# Patient Record
Sex: Male | Born: 1935 | Race: White | Hispanic: No | Marital: Married | Smoking: Never smoker
Health system: Southern US, Community
[De-identification: ages and names within clinical notes are randomized; demographics above are authoritative.]

## PROBLEM LIST (undated history)

## (undated) DIAGNOSIS — D696 Thrombocytopenia, unspecified: Secondary | ICD-10-CM

## (undated) DIAGNOSIS — R443 Hallucinations, unspecified: Secondary | ICD-10-CM

## (undated) DIAGNOSIS — E7800 Pure hypercholesterolemia, unspecified: Secondary | ICD-10-CM

## (undated) DIAGNOSIS — R413 Other amnesia: Secondary | ICD-10-CM

## (undated) DIAGNOSIS — I10 Essential (primary) hypertension: Secondary | ICD-10-CM

## (undated) HISTORY — PX: APPENDECTOMY: SHX54

## (undated) HISTORY — PX: SHOULDER SURGERY: SHX246

## (undated) HISTORY — DX: Other amnesia: R41.3

## (undated) HISTORY — DX: Hallucinations, unspecified: R44.3

---

## 1998-11-08 ENCOUNTER — Ambulatory Visit (HOSPITAL_COMMUNITY): Admission: RE | Admit: 1998-11-08 | Discharge: 1998-11-08 | Payer: Self-pay | Admitting: Internal Medicine

## 2003-01-05 ENCOUNTER — Encounter (INDEPENDENT_AMBULATORY_CARE_PROVIDER_SITE_OTHER): Payer: Self-pay | Admitting: Specialist

## 2003-01-05 ENCOUNTER — Ambulatory Visit (HOSPITAL_COMMUNITY): Admission: RE | Admit: 2003-01-05 | Discharge: 2003-01-05 | Payer: Self-pay | Admitting: Gastroenterology

## 2006-11-21 ENCOUNTER — Ambulatory Visit: Payer: Self-pay | Admitting: Internal Medicine

## 2006-12-13 ENCOUNTER — Encounter: Payer: Self-pay | Admitting: Internal Medicine

## 2006-12-13 ENCOUNTER — Ambulatory Visit: Payer: Self-pay

## 2006-12-13 ENCOUNTER — Ambulatory Visit: Payer: Self-pay | Admitting: Internal Medicine

## 2007-03-15 ENCOUNTER — Ambulatory Visit (HOSPITAL_COMMUNITY): Admission: RE | Admit: 2007-03-15 | Discharge: 2007-03-15 | Payer: Self-pay | Admitting: General Surgery

## 2010-10-04 NOTE — Op Note (Signed)
NAMECRYSTIAN, [name] NO.:  [id number]   MEDICAL RECORD NO.:  [id number]          PATIENT TYPE:  AMB   LOCATION:  DAY                          FACILITY:  [facility]   PHYSICIAN:  Lennie Muckle, MD      DATE OF BIRTH:  1921/02/18   DATE OF PROCEDURE:  03/15/2007  DATE OF DISCHARGE:                               OPERATIVE REPORT   PREOPERATIVE DIAGNOSES:  Left inguinal hernia.   POSTOPERATIVE DIAGNOSES:  Left inguinal hernia.   PROCEDURE:  Laparoscopic left inguinal hernia repair.   SURGEON:  Lennie Muckle, M.D.   ASSISTANT:  Angelia Mould. Derrell Lolling, M.D.   INDICATIONS FOR PROCEDURE:  Mr. [name] is an 77 year old male, who had  had previous hernia repair by Dr. Lurene Shadow approximately 35 years ago.  He  noticed swelling in his left groin and had complaints of pain.  Examination was consistent with left inguinal hernia.  Informed consent  was obtained prior to the procedure.   ESTIMATED BLOOD LOSS:  Minimal.   COMPLICATIONS:  No immediate complications.   DRAINS:  No drains were placed.   DETAILS OF PROCEDURE:  Mr. [name] was identified in the preoperative  holding suite, where he received IV antibiotics.  His left groin region  was marked.  He was then taken to the operating suite, where he was  placed in the supine position.  A Foley catheter was placed. After  administration of general endotracheal anesthesia, a time out was  performed indicating the patient and the procedure.  His lower abdomen  and groin area were prepped and draped in the usual sterile fashion.   Using local anesthetic of 0.25% Marcaine, infraumbilical skin was  anesthetized.  Using a #11 blade, skin was incised.  The anterior rectus  fascia was identified.  The anterior rectus fascia was incised.  The  left rectus muscle was pulled away to dissect the preperitoneal space.  A balloon dissector was placed into the preperitoneal space and  inflated.  The visualization of the preperitoneal space was  able to be  obtained with visualization through the trocar.  The balloon dissector  was removed and the trocar left in place for pneumoperitoneum into the  preperitoneal space.  The pubic tubercle was identified, as well asthe  left internal ring.  Two 5 mm trocars were placed in the midline area,  under visualization with the camera.  Using blunt graspers, the lateral  wall was carefully dissected.  The area around the internal ring was  also dissected.  There was a rather large hernia sac, which was  dissected away from the vas deferens and spermatic vessels.  Attempt to  fully retract the hernia sac from the coursing distally into the scrotum  was unsuccessful.  Due to the small size of the sac, I elected to ligate  the sac, due to no intestinal contents being located within the sac.  Using the laparoscopic scissors and electrocautery, the sac was  transected and distal portion allowed to retract down distally,  following the spermatic cord and vessels into the scrotal area.  Proximally,  the sac was pushed down towards the perineum.  There was  perhaps a small laxity in the area of the internal ring, but no  visualization of a direct hernia.   After carefully dissecting the spermatic cord and vessels and the  lateral abdominal wall, a 3 by 6 polypropylene mesh was placed into the  preperitoneal space and secured in place around the Cooper's ligament,  pubic tubercle and laterally.  The Pro-Tec device was palpated laterally  to be well-above the inguinal ligament.  Once the mesh was secured in  place, pneumoperitoneum was released.  The fascial defect closed with 0  Vicryl suture, skin was closed with 4-0 Monocryl.  Steri-Strips were  placed, followed by dressing.  Patient was then extubated and  transported to the postanesthesia care unit in stable condition.      Lennie Muckle, MD  Electronically Signed     ALA/MEDQ  D:  03/15/2007  T:  03/16/2007  Job:  [id number]

## 2010-10-04 NOTE — Letter (Signed)
November 22, 2006    Gwen Pounds, MD  [address]  [place]  [place] [address]   RE:  Juan, Shah  MRN:  [id number]  /  DOB:  09-Apr-1921   Dear Jonny Ruiz,   It was a pleasure to talk to you today about [name], it was a  pleasure to see him.  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  He is largely asymptomatic as best as I can tell, he has no complaints  of chest pain or shortness of breath.  He notes no change in his  exercise tolerance over the last year or so.   He does have one episode where he got lightheaded.  This occurred while  in [place], he felt sort of staggery, ended up at a hospital where he  recalled nothing being said about heart rate or blood pressure.   It was interesting that when you saw him his blood pressure was a little  bit low and you decreased his Cardura from twice a day to once a day  (see below).   He has no nocturnal dyspnea, no peripheral edema nor orthopnea.  He does  take naps in the daytime, but no significant daytime somnolence.   Apparently he has had a negative nuclear stress test in the past.   PAST MEDICAL HISTORY:  1. GE reflux disease.  2. Hiatal hernia.   REVIEW OF SYSTEMS:  Notable for cataracts and surgery in both eyes,  night sweats without weight loss or chills, problems with balance.   MEDICATIONS:  1. Cardura now at 1, maybe his balance is a little bit better.  2. Zocor.  3. __________ eye drops.  4. Timolol eye drops.  5. Bimatoprost eye drops.   HE HAS NO KNOWN DRUG ALLERGIES.   SOCIAL HISTORY:  He is married, he has 2 children.  He does not use  cigarettes or recreational drugs.  He does use alcohol occasionally.  He  walks and is quite vigorous.   EXAMINATION:  His blood pressure is 138/72, his  pulse is 57, his weight  was 196.  HEENT:  Demonstrated no drifts or xanthoma.  The neck veins were flat,  the carotid were brisk and full bilaterally without bruits.  BACK:  Without kyphosis or scoliosis.  LUNGS:  Clear.  HEART:  Sounds were regular without murmurs or gallops.  ABDOMEN:  Soft with active bowel sounds without midline pulsation or  hepatomegaly.  Femoral pulses were 2+, distal pulses were intact.  There is no  clubbing, cyanosis, or edema.  NEUROLOGICAL:  Exam was grossly normal.  SKIN:  Warm and dry.   Electrocardiogram today demonstrated sinus rhythm at 57 with an interval  of 0.18/0.09/0.41, the axis was mildly leftward, there were some T wave  inversions in the anterolateral leads.  There are Q waves in the  anteroseptum consistent with a prior septal infarction.   We submitted him for treadmill testing looking for chronotropic  competency, he was able to accomplish 85% of his predicted maximal heart  rate.  There is no significant ST changes with the treadmill.  I should  note that the repeat electrocardiogram at the time of the treadmill  failed to show any of  the septal abnormalities noted on the initial  electrocardiogram.   IMPRESSION:  1. Resting bradycardia, now improved.  2. Normal chronotropic competence.  3. Benign prostatic hypertrophy on Cardura with recent decremented      dose at least concurrent with improved heart rate,   John, I am not quite sure what caused [name]'s bradycardia.  As noted  I did not see anything in the literature allowing [place] in implicate  Cardura.  However, concurrent with your decrease his heart rate is  better.   I think a 24-hour Holter monitor would be important to make sure that  there is no significant bradycardia that we are not seeing.  Given the  abnormality on the electrocardiogram which unfortunately I did not see  until just now, I think it is probably prudent to get an echo at that  time too to make sure  there are no significant wall motion abnormalities  in this otherwise very vigorous gentleman.   Thanks very much for asking [place] to see him.    Sincerely,      Duke Salvia, MD, [facility]  Electronically Signed    SCK/MedQ  DD: 11/21/2006  DT: 11/22/2006  Job #: [id number]

## 2010-10-07 NOTE — Letter (Signed)
February 05, 2007    [facility]  [address], [address]  [place], [place] [address]   RE:  ADOLFO, GRANIERI  MRN:  [id number]  /  DOB:  1920/10/13   To whom it may concern,   Mr. Kashis Penley is an elderly gentleman  who has asymptomatic  bradycardia.  He has normal left ventricular function.  He has no known  coronary disease and has no symptoms of chest pain or exercise  intolerance.  He has apparently had a negative stress test in the past,  the results of which I have not seen, but with no intercurrent symptoms.   He should be an acceptable risk for surgery.  If I can be of any further  help, do not hesitate to contact me.    Sincerely,      Duke Salvia, MD, [facility]  Electronically Signed    SCK/MedQ  DD: 02/05/2007  DT: 02/06/2007  Job #: [id number]   CC:    Gwen Pounds, MD

## 2010-10-07 NOTE — Op Note (Signed)
   NAME:  [name], [name]                           ACCOUNT NO.:  [id number]   MEDICAL RECORD NO.:  [id number]                   PATIENT TYPE:  AMB   LOCATION:  ENDO                                 FACILITY:  [facility]   PHYSICIAN:  Bernette Redbird, M.D.                DATE OF BIRTH:  Sep 22, 1920   DATE OF PROCEDURE:  01/05/2003  DATE OF DISCHARGE:                                 OPERATIVE REPORT   PROCEDURE:  Colonoscopy with biopsies.   INDICATIONS:  A 77 year old for his colon cancer screening (no prior  screening exams, no symptoms).   FINDINGS:  1. Several diminutive polyps removed by cold biopsy.  2. Sigmoid diverticulosis.   CONSENT:  The nature, purpose and risks of this procedure had been discussed  with the patient who provided written consent.   SEDATION:  Fentanyl 75 mcg, Versed 6 mg IV without arrhythmias or  desaturation.   DESCRIPTION OF PROCEDURE:  Digital exam of the prostate was normal.  The  Olympus adjustable tension pediatric video coloscope was advanced to the  cecum, this identified by visualization of the ileocecal valve and the  absence of further lumen.  Pullback was then performed.  The quality of the  prep was excellent and it was felt that all areas were well seen.   In the transverse colon at about 70 cm, there was a 2 x 3 mm, very flap,  sessile polyp removed by several occult biopsies.   In the rectosigmoid were numerous (approximately five) small sessile, 2-3 mm  hyperplastic-appearing polyps removed by cold biopsy.  There were also some  tiny ones which were so flat that I did not feel that biopsy was necessary.  Retroflexion of the rectum prior to the biopsies was unremarkable.   No large polys, cancer, colitis or vascular malformations were noted.   There was moderate sigmoid diverticulosis.   Reinspection of the rectosigmoid disclosed no additional lesions.   The patient tolerated the procedure well and there were no apparent   complications.   IMPRESSION:  1. Several small colon polyps removed as described above.  2. Sigmoid diverticulosis.    PLAN:  Await pathology results.  If one or more adenomatous are present,  consider followup colonoscopy in five years; otherwise consider a screening  flexible sigmoidoscopy in five years.                                                 Bernette Redbird, M.D.    RB/MEDQ  D:  01/05/2003  T:  01/05/2003  Job:  [id number]   cc:   Gwen Pounds, M.D.  [address]  [place]  [place] [address]  Fax: [phone]

## 2011-03-01 LAB — CBC
Hemoglobin: 13.4
MCHC: 33.6
Platelets: 100 — ABNORMAL LOW

## 2011-03-01 LAB — BASIC METABOLIC PANEL
CO2: 28
Chloride: 102
Creatinine, Ser: 0.87
GFR calc Af Amer: >60
GFR calc non Af Amer: >60
Glucose, Bld: 113 — ABNORMAL HIGH
Potassium: 4
Sodium: 138

## 2011-03-01 LAB — DIFFERENTIAL
Basophils Relative: 0
Eosinophils Relative: 2
Lymphocytes Relative: 18
Lymphs Abs: 1.1
Monocytes Relative: 9
Neutro Abs: 4.4

## 2011-12-06 ENCOUNTER — Other Ambulatory Visit: Payer: Self-pay | Admitting: Internal Medicine

## 2011-12-06 DIAGNOSIS — R109 Unspecified abdominal pain: Secondary | ICD-10-CM

## 2011-12-25 ENCOUNTER — Ambulatory Visit
Admission: RE | Admit: 2011-12-25 | Discharge: 2011-12-25 | Disposition: A | Discharge status: Home / Self Care | Payer: Medicare PPO | Source: Ambulatory Visit | Attending: Internal Medicine | Admitting: Internal Medicine

## 2011-12-25 ENCOUNTER — Other Ambulatory Visit: Payer: Self-pay | Admitting: Internal Medicine

## 2011-12-25 DIAGNOSIS — R109 Unspecified abdominal pain: Secondary | ICD-10-CM

## 2011-12-25 DIAGNOSIS — R52 Pain, unspecified: Secondary | ICD-10-CM

## 2012-01-02 ENCOUNTER — Ambulatory Visit
Admission: RE | Admit: 2012-01-02 | Discharge: 2012-01-02 | Disposition: A | Discharge status: Home / Self Care | Payer: Medicare PPO | Source: Ambulatory Visit | Attending: Internal Medicine | Admitting: Internal Medicine

## 2012-01-02 ENCOUNTER — Other Ambulatory Visit: Payer: Self-pay | Admitting: Internal Medicine

## 2012-01-02 DIAGNOSIS — R52 Pain, unspecified: Secondary | ICD-10-CM

## 2013-03-07 ENCOUNTER — Encounter (HOSPITAL_COMMUNITY): Payer: Self-pay | Admitting: Emergency Medicine

## 2013-03-07 ENCOUNTER — Emergency Department (HOSPITAL_COMMUNITY)
Admission: EM | Admit: 2013-03-07 | Discharge: 2013-03-07 | Disposition: A | Discharge status: Home / Self Care | Payer: Medicare PPO | Attending: Emergency Medicine | Admitting: Emergency Medicine

## 2013-03-07 DIAGNOSIS — T6391XA Toxic effect of contact with unspecified venomous animal, accidental (unintentional), initial encounter: Secondary | ICD-10-CM | POA: Insufficient documentation

## 2013-03-07 DIAGNOSIS — I10 Essential (primary) hypertension: Secondary | ICD-10-CM | POA: Insufficient documentation

## 2013-03-07 DIAGNOSIS — Z8639 Personal history of other endocrine, nutritional and metabolic disease: Secondary | ICD-10-CM | POA: Insufficient documentation

## 2013-03-07 DIAGNOSIS — Y929 Unspecified place or not applicable: Secondary | ICD-10-CM | POA: Insufficient documentation

## 2013-03-07 DIAGNOSIS — Z862 Personal history of diseases of the blood and blood-forming organs and certain disorders involving the immune mechanism: Secondary | ICD-10-CM | POA: Insufficient documentation

## 2013-03-07 DIAGNOSIS — Y939 Activity, unspecified: Secondary | ICD-10-CM | POA: Insufficient documentation

## 2013-03-07 DIAGNOSIS — T63461A Toxic effect of venom of wasps, accidental (unintentional), initial encounter: Secondary | ICD-10-CM | POA: Insufficient documentation

## 2013-03-07 HISTORY — DX: Essential (primary) hypertension: I10

## 2013-03-07 HISTORY — DX: Pure hypercholesterolemia, unspecified: E78.00

## 2013-03-07 NOTE — ED Provider Notes (Signed)
Medical screening examination/treatment/procedure(s) were performed by non-physician practitioner and as supervising physician I was immediately available for consultation/collaboration.    Dulcinea Kinser R Sion Reinders, MD 03/07/13 2119 

## 2013-03-07 NOTE — ED Notes (Signed)
Pt has swelling to lt 1st digit from a bee sting 45 mins ago, no other complaints

## 2013-03-07 NOTE — ED Provider Notes (Signed)
CSN: [id number]     Arrival date & time 03/07/13  1858 History   This chart was scribed for non-physician practitioner Kyung Bacca, PA-C working with No att. providers found by Caryn Bee, ED Scribe. This patient was seen in room WTR9/WTR9 and the patient's care was started at 8:40 PM.    Chief Complaint  Patient presents with  . Insect Bite   HPI HPI Comments: [name] is a 79 y.o. male who presents to the Emergency Department complaining of bee sting on his left index finger that occurred at about 6:15 PM. Pt states that his finger was swollen after onset and painful. The swelling and pain are both currently minimal since applying ice in waiting room of ED. No known allergy to bees/wasps and denies throat tightness, tongue/lip edema, SOB, rash.    Past Medical History  Diagnosis Date  . Hypertension   . High cholesterol    Past Surgical History  Procedure Laterality Date  . Appendectomy    . Shoulder surgery     No family history on file. History  Substance Use Topics  . Smoking status: Never Smoker   . Smokeless tobacco: Not on file  . Alcohol Use: 1.2 oz/week    2 Glasses of wine per week    Review of Systems  Skin: Positive for wound (Bee sting). Negative for rash.  All other systems reviewed and are negative.    Allergies  Review of patient's allergies indicates no known allergies.  Home Medications  No current outpatient prescriptions on file.  Triage Vitals: BP 130/77  Pulse 56  Temp(Src) 98 F (36.7 C) (Oral)  Resp 18  Wt 174 lb (78.926 kg)  SpO2 96%  Physical Exam  Nursing note and vitals reviewed. Constitutional: He is oriented to person, place, and time. He appears well-developed and well-nourished. No distress.  HENT:  Head: Normocephalic and atraumatic.  Mouth/Throat: Oropharynx is clear and moist and mucous membranes are normal. No posterior oropharyngeal edema.  No lip or tongue edema.  Eyes:  Normal appearance  Neck: Normal  range of motion.  Cardiovascular: Normal rate and regular rhythm.   Pulmonary/Chest: Effort normal and breath sounds normal. No stridor. No respiratory distress.  Musculoskeletal: Normal range of motion.  Bee sting site on dorsal surface of distal phalanx of L index finger.  No surrounding erythema/drainage/induration.  Non-tender.  Full, active ROM of finger.  Brisk cap refill and distal sensation intact.  Neurological: He is alert and oriented to person, place, and time.  Skin: Skin is warm and dry. No rash noted.  Psychiatric: He has a normal mood and affect. His behavior is normal.    ED Course  Procedures (including critical care time) DIAGNOSTIC STUDIES: Oxygen Saturation is 96% on room air, normal by my interpretation.    COORDINATION OF CARE: 8:42 PM-Discussed treatment plan which includes discharge with pt at bedside and pt agreed to plan. Advised pt to take a dose of benadryl tonight and another if the swelling returns tomorrow. Also advised pt to use ice to treat the area and to elevate his hand.   Labs Review Labs Reviewed - No data to display Imaging Review No results found.  EKG Interpretation   None       MDM  No diagnosis found. 79yo M presents to ED w/ insect sting of left index finger.   Pain and edema have improved w/ ice, provided in triage.  No signs of systemic histamine reaction.  Pt reassured and  I recommended ice, elevation and benadryl prn.  Return precautions discussed. 8:49 PM   I personally performed the services described in this documentation, which was scribed in my presence. The recorded information has been reviewed and is accurate.    Otilio Miu, PA-C 03/07/13 2052

## 2014-06-18 DIAGNOSIS — L821 Other seborrheic keratosis: Secondary | ICD-10-CM | POA: Diagnosis not present

## 2014-06-18 DIAGNOSIS — L309 Dermatitis, unspecified: Secondary | ICD-10-CM | POA: Diagnosis not present

## 2014-06-22 DIAGNOSIS — H409 Unspecified glaucoma: Secondary | ICD-10-CM | POA: Diagnosis not present

## 2014-06-22 DIAGNOSIS — Z1389 Encounter for screening for other disorder: Secondary | ICD-10-CM | POA: Diagnosis not present

## 2014-06-22 DIAGNOSIS — E785 Hyperlipidemia, unspecified: Secondary | ICD-10-CM | POA: Diagnosis not present

## 2014-06-22 DIAGNOSIS — I10 Essential (primary) hypertension: Secondary | ICD-10-CM | POA: Diagnosis not present

## 2014-06-22 DIAGNOSIS — I351 Nonrheumatic aortic (valve) insufficiency: Secondary | ICD-10-CM | POA: Diagnosis not present

## 2014-06-22 DIAGNOSIS — Z6825 Body mass index (BMI) 25.0-25.9, adult: Secondary | ICD-10-CM | POA: Diagnosis not present

## 2014-06-22 DIAGNOSIS — M353 Polymyalgia rheumatica: Secondary | ICD-10-CM | POA: Diagnosis not present

## 2014-06-25 DIAGNOSIS — H5211 Myopia, right eye: Secondary | ICD-10-CM | POA: Diagnosis not present

## 2014-06-25 DIAGNOSIS — H5210 Myopia, unspecified eye: Secondary | ICD-10-CM | POA: Diagnosis not present

## 2014-08-19 DIAGNOSIS — S61402A Unspecified open wound of left hand, initial encounter: Secondary | ICD-10-CM | POA: Diagnosis not present

## 2014-08-19 DIAGNOSIS — S51012A Laceration without foreign body of left elbow, initial encounter: Secondary | ICD-10-CM | POA: Diagnosis not present

## 2014-12-25 DIAGNOSIS — Z125 Encounter for screening for malignant neoplasm of prostate: Secondary | ICD-10-CM | POA: Diagnosis not present

## 2014-12-25 DIAGNOSIS — I10 Essential (primary) hypertension: Secondary | ICD-10-CM | POA: Diagnosis not present

## 2014-12-25 DIAGNOSIS — E785 Hyperlipidemia, unspecified: Secondary | ICD-10-CM | POA: Diagnosis not present

## 2014-12-29 DIAGNOSIS — H409 Unspecified glaucoma: Secondary | ICD-10-CM | POA: Diagnosis not present

## 2014-12-29 DIAGNOSIS — I8390 Asymptomatic varicose veins of unspecified lower extremity: Secondary | ICD-10-CM | POA: Diagnosis not present

## 2014-12-29 DIAGNOSIS — D692 Other nonthrombocytopenic purpura: Secondary | ICD-10-CM | POA: Diagnosis not present

## 2014-12-29 DIAGNOSIS — M353 Polymyalgia rheumatica: Secondary | ICD-10-CM | POA: Diagnosis not present

## 2014-12-29 DIAGNOSIS — R001 Bradycardia, unspecified: Secondary | ICD-10-CM | POA: Diagnosis not present

## 2014-12-29 DIAGNOSIS — I10 Essential (primary) hypertension: Secondary | ICD-10-CM | POA: Diagnosis not present

## 2014-12-29 DIAGNOSIS — I351 Nonrheumatic aortic (valve) insufficiency: Secondary | ICD-10-CM | POA: Diagnosis not present

## 2014-12-29 DIAGNOSIS — E785 Hyperlipidemia, unspecified: Secondary | ICD-10-CM | POA: Diagnosis not present

## 2015-02-04 DIAGNOSIS — Z6824 Body mass index (BMI) 24.0-24.9, adult: Secondary | ICD-10-CM | POA: Diagnosis not present

## 2015-02-04 DIAGNOSIS — T1490 Injury, unspecified: Secondary | ICD-10-CM | POA: Diagnosis not present

## 2015-04-02 DIAGNOSIS — T63441A Toxic effect of venom of bees, accidental (unintentional), initial encounter: Secondary | ICD-10-CM | POA: Diagnosis not present

## 2015-04-02 DIAGNOSIS — L500 Allergic urticaria: Secondary | ICD-10-CM | POA: Diagnosis not present

## 2015-05-05 DIAGNOSIS — M25552 Pain in left hip: Secondary | ICD-10-CM | POA: Diagnosis not present

## 2015-05-05 DIAGNOSIS — Z6824 Body mass index (BMI) 24.0-24.9, adult: Secondary | ICD-10-CM | POA: Diagnosis not present

## 2015-05-27 DIAGNOSIS — R0602 Shortness of breath: Secondary | ICD-10-CM | POA: Diagnosis not present

## 2015-05-27 DIAGNOSIS — R05 Cough: Secondary | ICD-10-CM | POA: Diagnosis not present

## 2015-07-01 DIAGNOSIS — I10 Essential (primary) hypertension: Secondary | ICD-10-CM | POA: Diagnosis not present

## 2015-07-01 DIAGNOSIS — R2689 Other abnormalities of gait and mobility: Secondary | ICD-10-CM | POA: Diagnosis not present

## 2015-07-01 DIAGNOSIS — M25552 Pain in left hip: Secondary | ICD-10-CM | POA: Diagnosis not present

## 2015-07-01 DIAGNOSIS — E784 Other hyperlipidemia: Secondary | ICD-10-CM | POA: Diagnosis not present

## 2015-07-01 DIAGNOSIS — D696 Thrombocytopenia, unspecified: Secondary | ICD-10-CM | POA: Diagnosis not present

## 2015-07-01 DIAGNOSIS — Z6823 Body mass index (BMI) 23.0-23.9, adult: Secondary | ICD-10-CM | POA: Diagnosis not present

## 2015-07-01 DIAGNOSIS — R001 Bradycardia, unspecified: Secondary | ICD-10-CM | POA: Diagnosis not present

## 2015-07-01 DIAGNOSIS — M353 Polymyalgia rheumatica: Secondary | ICD-10-CM | POA: Diagnosis not present

## 2015-07-01 DIAGNOSIS — R42 Dizziness and giddiness: Secondary | ICD-10-CM | POA: Diagnosis not present

## 2015-07-07 DIAGNOSIS — H5210 Myopia, unspecified eye: Secondary | ICD-10-CM | POA: Diagnosis not present

## 2015-07-07 DIAGNOSIS — H524 Presbyopia: Secondary | ICD-10-CM | POA: Diagnosis not present

## 2015-08-11 DIAGNOSIS — H401133 Primary open-angle glaucoma, bilateral, severe stage: Secondary | ICD-10-CM | POA: Diagnosis not present

## 2015-08-11 DIAGNOSIS — Z961 Presence of intraocular lens: Secondary | ICD-10-CM | POA: Diagnosis not present

## 2015-09-28 DIAGNOSIS — D1801 Hemangioma of skin and subcutaneous tissue: Secondary | ICD-10-CM | POA: Diagnosis not present

## 2015-09-28 DIAGNOSIS — L820 Inflamed seborrheic keratosis: Secondary | ICD-10-CM | POA: Diagnosis not present

## 2015-09-28 DIAGNOSIS — L821 Other seborrheic keratosis: Secondary | ICD-10-CM | POA: Diagnosis not present

## 2015-09-28 DIAGNOSIS — L570 Actinic keratosis: Secondary | ICD-10-CM | POA: Diagnosis not present

## 2015-09-28 DIAGNOSIS — D225 Melanocytic nevi of trunk: Secondary | ICD-10-CM | POA: Diagnosis not present

## 2015-09-28 DIAGNOSIS — L814 Other melanin hyperpigmentation: Secondary | ICD-10-CM | POA: Diagnosis not present

## 2015-12-07 DIAGNOSIS — L0889 Other specified local infections of the skin and subcutaneous tissue: Secondary | ICD-10-CM | POA: Diagnosis not present

## 2016-01-06 DIAGNOSIS — E784 Other hyperlipidemia: Secondary | ICD-10-CM | POA: Diagnosis not present

## 2016-01-06 DIAGNOSIS — Z125 Encounter for screening for malignant neoplasm of prostate: Secondary | ICD-10-CM | POA: Diagnosis not present

## 2016-01-06 DIAGNOSIS — I10 Essential (primary) hypertension: Secondary | ICD-10-CM | POA: Diagnosis not present

## 2016-01-06 DIAGNOSIS — R8299 Other abnormal findings in urine: Secondary | ICD-10-CM | POA: Diagnosis not present

## 2016-01-13 DIAGNOSIS — D692 Other nonthrombocytopenic purpura: Secondary | ICD-10-CM | POA: Diagnosis not present

## 2016-01-13 DIAGNOSIS — E784 Other hyperlipidemia: Secondary | ICD-10-CM | POA: Diagnosis not present

## 2016-01-13 DIAGNOSIS — D696 Thrombocytopenia, unspecified: Secondary | ICD-10-CM | POA: Diagnosis not present

## 2016-01-13 DIAGNOSIS — I10 Essential (primary) hypertension: Secondary | ICD-10-CM | POA: Diagnosis not present

## 2016-01-13 DIAGNOSIS — M353 Polymyalgia rheumatica: Secondary | ICD-10-CM | POA: Diagnosis not present

## 2016-01-13 DIAGNOSIS — Z0000 Encounter for general adult medical examination without abnormal findings: Secondary | ICD-10-CM | POA: Diagnosis not present

## 2016-01-13 DIAGNOSIS — R2689 Other abnormalities of gait and mobility: Secondary | ICD-10-CM | POA: Diagnosis not present

## 2016-01-13 DIAGNOSIS — H4089 Other specified glaucoma: Secondary | ICD-10-CM | POA: Diagnosis not present

## 2016-01-13 DIAGNOSIS — I351 Nonrheumatic aortic (valve) insufficiency: Secondary | ICD-10-CM | POA: Diagnosis not present

## 2016-06-13 DIAGNOSIS — L821 Other seborrheic keratosis: Secondary | ICD-10-CM | POA: Diagnosis not present

## 2016-06-13 DIAGNOSIS — L219 Seborrheic dermatitis, unspecified: Secondary | ICD-10-CM | POA: Diagnosis not present

## 2016-06-13 DIAGNOSIS — D225 Melanocytic nevi of trunk: Secondary | ICD-10-CM | POA: Diagnosis not present

## 2016-06-13 DIAGNOSIS — L814 Other melanin hyperpigmentation: Secondary | ICD-10-CM | POA: Diagnosis not present

## 2016-07-20 DIAGNOSIS — R2689 Other abnormalities of gait and mobility: Secondary | ICD-10-CM | POA: Diagnosis not present

## 2016-07-20 DIAGNOSIS — Z6824 Body mass index (BMI) 24.0-24.9, adult: Secondary | ICD-10-CM | POA: Diagnosis not present

## 2016-07-20 DIAGNOSIS — H4089 Other specified glaucoma: Secondary | ICD-10-CM | POA: Diagnosis not present

## 2016-07-20 DIAGNOSIS — I351 Nonrheumatic aortic (valve) insufficiency: Secondary | ICD-10-CM | POA: Diagnosis not present

## 2016-07-20 DIAGNOSIS — D698 Other specified hemorrhagic conditions: Secondary | ICD-10-CM | POA: Diagnosis not present

## 2016-07-20 DIAGNOSIS — M353 Polymyalgia rheumatica: Secondary | ICD-10-CM | POA: Diagnosis not present

## 2016-07-20 DIAGNOSIS — D692 Other nonthrombocytopenic purpura: Secondary | ICD-10-CM | POA: Diagnosis not present

## 2016-07-20 DIAGNOSIS — I10 Essential (primary) hypertension: Secondary | ICD-10-CM | POA: Diagnosis not present

## 2016-08-15 DIAGNOSIS — H524 Presbyopia: Secondary | ICD-10-CM | POA: Diagnosis not present

## 2016-10-12 DIAGNOSIS — M25562 Pain in left knee: Secondary | ICD-10-CM | POA: Diagnosis not present

## 2016-10-12 DIAGNOSIS — M170 Bilateral primary osteoarthritis of knee: Secondary | ICD-10-CM | POA: Diagnosis not present

## 2016-10-12 DIAGNOSIS — M25561 Pain in right knee: Secondary | ICD-10-CM | POA: Diagnosis not present

## 2016-10-19 DIAGNOSIS — R262 Difficulty in walking, not elsewhere classified: Secondary | ICD-10-CM | POA: Diagnosis not present

## 2016-10-19 DIAGNOSIS — M25561 Pain in right knee: Secondary | ICD-10-CM | POA: Diagnosis not present

## 2016-10-19 DIAGNOSIS — M1712 Unilateral primary osteoarthritis, left knee: Secondary | ICD-10-CM | POA: Diagnosis not present

## 2016-10-19 DIAGNOSIS — M25562 Pain in left knee: Secondary | ICD-10-CM | POA: Diagnosis not present

## 2016-10-19 DIAGNOSIS — M1711 Unilateral primary osteoarthritis, right knee: Secondary | ICD-10-CM | POA: Diagnosis not present

## 2016-10-25 DIAGNOSIS — M1711 Unilateral primary osteoarthritis, right knee: Secondary | ICD-10-CM | POA: Diagnosis not present

## 2016-10-25 DIAGNOSIS — M25561 Pain in right knee: Secondary | ICD-10-CM | POA: Diagnosis not present

## 2016-10-31 DIAGNOSIS — M1712 Unilateral primary osteoarthritis, left knee: Secondary | ICD-10-CM | POA: Diagnosis not present

## 2016-10-31 DIAGNOSIS — M25562 Pain in left knee: Secondary | ICD-10-CM | POA: Diagnosis not present

## 2016-11-07 DIAGNOSIS — M1711 Unilateral primary osteoarthritis, right knee: Secondary | ICD-10-CM | POA: Diagnosis not present

## 2016-11-07 DIAGNOSIS — M25561 Pain in right knee: Secondary | ICD-10-CM | POA: Diagnosis not present

## 2016-11-14 DIAGNOSIS — M170 Bilateral primary osteoarthritis of knee: Secondary | ICD-10-CM | POA: Diagnosis not present

## 2016-11-14 DIAGNOSIS — M25562 Pain in left knee: Secondary | ICD-10-CM | POA: Diagnosis not present

## 2016-11-14 DIAGNOSIS — M25561 Pain in right knee: Secondary | ICD-10-CM | POA: Diagnosis not present

## 2017-01-09 DIAGNOSIS — I10 Essential (primary) hypertension: Secondary | ICD-10-CM | POA: Diagnosis not present

## 2017-01-09 DIAGNOSIS — E784 Other hyperlipidemia: Secondary | ICD-10-CM | POA: Diagnosis not present

## 2017-01-09 DIAGNOSIS — Z125 Encounter for screening for malignant neoplasm of prostate: Secondary | ICD-10-CM | POA: Diagnosis not present

## 2017-01-16 DIAGNOSIS — D692 Other nonthrombocytopenic purpura: Secondary | ICD-10-CM | POA: Diagnosis not present

## 2017-01-16 DIAGNOSIS — I351 Nonrheumatic aortic (valve) insufficiency: Secondary | ICD-10-CM | POA: Diagnosis not present

## 2017-01-16 DIAGNOSIS — H4089 Other specified glaucoma: Secondary | ICD-10-CM | POA: Diagnosis not present

## 2017-01-16 DIAGNOSIS — R3989 Other symptoms and signs involving the genitourinary system: Secondary | ICD-10-CM | POA: Diagnosis not present

## 2017-01-16 DIAGNOSIS — E784 Other hyperlipidemia: Secondary | ICD-10-CM | POA: Diagnosis not present

## 2017-01-16 DIAGNOSIS — Z0000 Encounter for general adult medical examination without abnormal findings: Secondary | ICD-10-CM | POA: Diagnosis not present

## 2017-01-16 DIAGNOSIS — Z23 Encounter for immunization: Secondary | ICD-10-CM | POA: Diagnosis not present

## 2017-01-16 DIAGNOSIS — R627 Adult failure to thrive: Secondary | ICD-10-CM | POA: Diagnosis not present

## 2017-01-16 DIAGNOSIS — Z6824 Body mass index (BMI) 24.0-24.9, adult: Secondary | ICD-10-CM | POA: Diagnosis not present

## 2017-01-16 DIAGNOSIS — I10 Essential (primary) hypertension: Secondary | ICD-10-CM | POA: Diagnosis not present

## 2017-07-17 DIAGNOSIS — R2689 Other abnormalities of gait and mobility: Secondary | ICD-10-CM | POA: Diagnosis not present

## 2017-07-17 DIAGNOSIS — I10 Essential (primary) hypertension: Secondary | ICD-10-CM | POA: Diagnosis not present

## 2017-07-17 DIAGNOSIS — E7849 Other hyperlipidemia: Secondary | ICD-10-CM | POA: Diagnosis not present

## 2017-07-17 DIAGNOSIS — D692 Other nonthrombocytopenic purpura: Secondary | ICD-10-CM | POA: Diagnosis not present

## 2017-07-17 DIAGNOSIS — I351 Nonrheumatic aortic (valve) insufficiency: Secondary | ICD-10-CM | POA: Diagnosis not present

## 2017-07-17 DIAGNOSIS — R3989 Other symptoms and signs involving the genitourinary system: Secondary | ICD-10-CM | POA: Diagnosis not present

## 2017-07-17 DIAGNOSIS — H4089 Other specified glaucoma: Secondary | ICD-10-CM | POA: Diagnosis not present

## 2017-07-17 DIAGNOSIS — R05 Cough: Secondary | ICD-10-CM | POA: Diagnosis not present

## 2017-07-17 DIAGNOSIS — R627 Adult failure to thrive: Secondary | ICD-10-CM | POA: Diagnosis not present

## 2017-09-19 DIAGNOSIS — H01115 Allergic dermatitis of left lower eyelid: Secondary | ICD-10-CM | POA: Diagnosis not present

## 2017-10-18 DIAGNOSIS — L570 Actinic keratosis: Secondary | ICD-10-CM | POA: Diagnosis not present

## 2017-10-18 DIAGNOSIS — L578 Other skin changes due to chronic exposure to nonionizing radiation: Secondary | ICD-10-CM | POA: Diagnosis not present

## 2017-10-18 DIAGNOSIS — D1801 Hemangioma of skin and subcutaneous tissue: Secondary | ICD-10-CM | POA: Diagnosis not present

## 2017-10-18 DIAGNOSIS — L821 Other seborrheic keratosis: Secondary | ICD-10-CM | POA: Diagnosis not present

## 2017-10-18 DIAGNOSIS — L819 Disorder of pigmentation, unspecified: Secondary | ICD-10-CM | POA: Diagnosis not present

## 2017-10-18 DIAGNOSIS — L814 Other melanin hyperpigmentation: Secondary | ICD-10-CM | POA: Diagnosis not present

## 2017-10-18 DIAGNOSIS — D229 Melanocytic nevi, unspecified: Secondary | ICD-10-CM | POA: Diagnosis not present

## 2018-01-15 DIAGNOSIS — Z125 Encounter for screening for malignant neoplasm of prostate: Secondary | ICD-10-CM | POA: Diagnosis not present

## 2018-01-15 DIAGNOSIS — R82998 Other abnormal findings in urine: Secondary | ICD-10-CM | POA: Diagnosis not present

## 2018-01-15 DIAGNOSIS — I10 Essential (primary) hypertension: Secondary | ICD-10-CM | POA: Diagnosis not present

## 2018-01-15 DIAGNOSIS — E7849 Other hyperlipidemia: Secondary | ICD-10-CM | POA: Diagnosis not present

## 2018-01-30 DIAGNOSIS — H4089 Other specified glaucoma: Secondary | ICD-10-CM | POA: Diagnosis not present

## 2018-01-30 DIAGNOSIS — R338 Other retention of urine: Secondary | ICD-10-CM | POA: Diagnosis not present

## 2018-01-30 DIAGNOSIS — I351 Nonrheumatic aortic (valve) insufficiency: Secondary | ICD-10-CM | POA: Diagnosis not present

## 2018-01-30 DIAGNOSIS — E7849 Other hyperlipidemia: Secondary | ICD-10-CM | POA: Diagnosis not present

## 2018-01-30 DIAGNOSIS — R0789 Other chest pain: Secondary | ICD-10-CM | POA: Diagnosis not present

## 2018-01-30 DIAGNOSIS — R627 Adult failure to thrive: Secondary | ICD-10-CM | POA: Diagnosis not present

## 2018-01-30 DIAGNOSIS — R2689 Other abnormalities of gait and mobility: Secondary | ICD-10-CM | POA: Diagnosis not present

## 2018-01-30 DIAGNOSIS — D692 Other nonthrombocytopenic purpura: Secondary | ICD-10-CM | POA: Diagnosis not present

## 2018-01-30 DIAGNOSIS — Z0000 Encounter for general adult medical examination without abnormal findings: Secondary | ICD-10-CM | POA: Diagnosis not present

## 2018-02-18 DIAGNOSIS — L821 Other seborrheic keratosis: Secondary | ICD-10-CM | POA: Diagnosis not present

## 2018-02-18 DIAGNOSIS — L814 Other melanin hyperpigmentation: Secondary | ICD-10-CM | POA: Diagnosis not present

## 2018-02-18 DIAGNOSIS — L819 Disorder of pigmentation, unspecified: Secondary | ICD-10-CM | POA: Diagnosis not present

## 2018-02-18 DIAGNOSIS — D1801 Hemangioma of skin and subcutaneous tissue: Secondary | ICD-10-CM | POA: Diagnosis not present

## 2018-02-18 DIAGNOSIS — L708 Other acne: Secondary | ICD-10-CM | POA: Diagnosis not present

## 2018-02-18 DIAGNOSIS — D229 Melanocytic nevi, unspecified: Secondary | ICD-10-CM | POA: Diagnosis not present

## 2018-02-22 DIAGNOSIS — Z23 Encounter for immunization: Secondary | ICD-10-CM | POA: Diagnosis not present

## 2018-03-05 DIAGNOSIS — H1131 Conjunctival hemorrhage, right eye: Secondary | ICD-10-CM | POA: Diagnosis not present

## 2018-03-20 DIAGNOSIS — R05 Cough: Secondary | ICD-10-CM | POA: Diagnosis not present

## 2018-04-17 ENCOUNTER — Other Ambulatory Visit: Payer: Self-pay

## 2018-04-17 ENCOUNTER — Emergency Department (HOSPITAL_COMMUNITY): Payer: Medicare HMO

## 2018-04-17 ENCOUNTER — Encounter (HOSPITAL_COMMUNITY): Payer: Self-pay

## 2018-04-17 ENCOUNTER — Emergency Department (HOSPITAL_COMMUNITY)
Admission: EM | Admit: 2018-04-17 | Discharge: 2018-04-17 | Disposition: A | Discharge status: Home / Self Care | Payer: Medicare HMO | Attending: Emergency Medicine | Admitting: Emergency Medicine

## 2018-04-17 DIAGNOSIS — I10 Essential (primary) hypertension: Secondary | ICD-10-CM | POA: Insufficient documentation

## 2018-04-17 DIAGNOSIS — R443 Hallucinations, unspecified: Secondary | ICD-10-CM | POA: Diagnosis not present

## 2018-04-17 DIAGNOSIS — R404 Transient alteration of awareness: Secondary | ICD-10-CM

## 2018-04-17 DIAGNOSIS — R4182 Altered mental status, unspecified: Secondary | ICD-10-CM | POA: Diagnosis not present

## 2018-04-17 DIAGNOSIS — R442 Other hallucinations: Secondary | ICD-10-CM | POA: Diagnosis not present

## 2018-04-17 LAB — URINALYSIS, ROUTINE W REFLEX MICROSCOPIC
Bacteria, UA: NONE SEEN
Bilirubin Urine: NEGATIVE
Glucose, UA: NEGATIVE mg/dL
Ketones, ur: NEGATIVE mg/dL
LEUKOCYTES UA: NEGATIVE
Nitrite: NEGATIVE
Protein, ur: NEGATIVE mg/dL
SPECIFIC GRAVITY, URINE: 1.008 (ref 1.005–1.030)
pH: 5 (ref 5.0–8.0)

## 2018-04-17 LAB — I-STAT CHEM 8, ED
BUN: 18 mg/dL (ref 8–23)
CALCIUM ION: 1.05 mmol/L — AB (ref 1.15–1.40)
Chloride: 105 mmol/L (ref 98–111)
Creatinine, Ser: 0.7 mg/dL (ref 0.61–1.24)
Glucose, Bld: 83 mg/dL (ref 70–99)
HCT: 38 % — ABNORMAL LOW (ref 39.0–52.0)
Hemoglobin: 12.9 g/dL — ABNORMAL LOW (ref 13.0–17.0)
POTASSIUM: 3.9 mmol/L (ref 3.5–5.1)
Sodium: 139 mmol/L (ref 135–145)
TCO2: 28 mmol/L (ref 22–32)

## 2018-04-17 LAB — RAPID URINE DRUG SCREEN, HOSP PERFORMED
Amphetamines: NOT DETECTED
BARBITURATES: NOT DETECTED
Benzodiazepines: NOT DETECTED
COCAINE: NOT DETECTED
Opiates: NOT DETECTED
TETRAHYDROCANNABINOL: NOT DETECTED

## 2018-04-17 LAB — COMPREHENSIVE METABOLIC PANEL
ALBUMIN: 3 g/dL — AB (ref 3.5–5.0)
ALK PHOS: 44 U/L (ref 38–126)
ALT: 12 U/L (ref 0–44)
AST: 18 U/L (ref 15–41)
Anion gap: 5 (ref 5–15)
BUN: 15 mg/dL (ref 8–23)
CALCIUM: 7.7 mg/dL — AB (ref 8.9–10.3)
CO2: 25 mmol/L (ref 22–32)
CREATININE: 0.73 mg/dL (ref 0.61–1.24)
Chloride: 107 mmol/L (ref 98–111)
GFR calc Af Amer: >60 mL/min (ref >60)
GFR calc non Af Amer: >60 mL/min (ref >60)
GLUCOSE: 87 mg/dL (ref 70–99)
Potassium: 3.5 mmol/L (ref 3.5–5.1)
SODIUM: 137 mmol/L (ref 135–145)
Total Bilirubin: 0.8 mg/dL (ref 0.3–1.2)
Total Protein: 5.5 g/dL — ABNORMAL LOW (ref 6.5–8.1)

## 2018-04-17 LAB — CBC WITH DIFFERENTIAL/PLATELET
Abs Immature Granulocytes: 0.12 10*3/uL — ABNORMAL HIGH (ref 0.00–0.07)
Basophils Absolute: 0 10*3/uL (ref 0.0–0.1)
Basophils Relative: 0 %
EOS ABS: 0.2 10*3/uL (ref 0.0–0.5)
EOS PCT: 4 %
HCT: 40.9 % (ref 39.0–52.0)
Hemoglobin: 12.4 g/dL — ABNORMAL LOW (ref 13.0–17.0)
IMMATURE GRANULOCYTES: 3 %
Lymphocytes Relative: 18 %
Lymphs Abs: 0.9 10*3/uL (ref 0.7–4.0)
MCH: 29.8 pg (ref 26.0–34.0)
MCHC: 30.3 g/dL (ref 30.0–36.0)
MCV: 98.3 fL (ref 80.0–100.0)
MONOS PCT: 30 %
Monocytes Absolute: 1.4 10*3/uL — ABNORMAL HIGH (ref 0.1–1.0)
NEUTROS PCT: 45 %
NRBC: 0 % (ref 0.0–0.2)
Neutro Abs: 2.1 10*3/uL (ref 1.7–7.7)
Platelets: 62 10*3/uL — ABNORMAL LOW (ref 150–400)
RBC: 4.16 MIL/uL — ABNORMAL LOW (ref 4.22–5.81)
RDW: 14.6 % (ref 11.5–15.5)
WBC: 4.7 10*3/uL (ref 4.0–10.5)

## 2018-04-17 LAB — ACETAMINOPHEN LEVEL

## 2018-04-17 LAB — SALICYLATE LEVEL: Salicylate Lvl: <7 mg/dL (ref 2.8–30.0)

## 2018-04-17 LAB — CBG MONITORING, ED: Glucose-Capillary: 75 mg/dL (ref 70–99)

## 2018-04-17 LAB — ETHANOL: Alcohol, Ethyl (B): <10 mg/dL (ref <10)

## 2018-04-17 NOTE — ED Notes (Signed)
Patient verbalizes understanding of discharge instructions. Opportunity for questioning and answers were provided. Armband removed by staff, pt discharged from ED.  

## 2018-04-17 NOTE — Discharge Instructions (Addendum)
It was our pleasure to provide your ER care today - we hope that you feel better.  Follow up with your primary care doctor in the next few days.  Return to ER if worse, new symptoms, fevers, trouble breathing, new or severe pain, other concern.

## 2018-04-17 NOTE — ED Provider Notes (Signed)
Emergency Department Provider Note   I have reviewed the triage vital signs and the nursing notes.   HISTORY  Chief Complaint Hallucinations   HPI [name] is a 84 y.o. male hypertension, hyperlipidemia, disc difficulty hearing, glaucoma and difficulty seeing the presents to the emergency department today secondary to strange behavior.  Patient states that he is been seeing flowers on the wall but he knows are not there and he cannot touch them.  He thinks is probably related to his glaucoma.  Has daughter arrived and states that he has been saying abnormal things were last week and a half and been increasing in sleepiness.  No other known symptoms per the daughter. No other associated or modifying symptoms.    Past Medical History:  Diagnosis Date  . High cholesterol   . Hypertension     There are no active problems to display for this patient.   Past Surgical History:  Procedure Laterality Date  . APPENDECTOMY    . SHOULDER SURGERY      Current Outpatient Rx  . Order #: [id number] Class: Historical Med  . Order #: [id number] Class: Historical Med  . Order #: [id number] Class: Historical Med  . Order #: [id number] Class: Historical Med    Allergies Patient has no known allergies.  History reviewed. No pertinent family history.  Social History Social History   Tobacco Use  . Smoking status: Never Smoker  . Smokeless tobacco: Never Used  Substance Use Topics  . Alcohol use: Yes    Alcohol/week: 2.0 standard drinks    Types: 2 Glasses of wine per week  . Drug use: Not on file    Review of Systems  All other systems negative except as documented in the HPI. All pertinent positives and negatives as reviewed in the HPI. ____________________________________________   PHYSICAL EXAM:  VITAL SIGNS: Blood pressure (!) 141/89, pulse 69, temperature 98.7 F (37.1 C), temperature source Oral, resp. rate 16, SpO2 100 %.  Constitutional: Alert and oriented. Well  appearing and in no acute distress. Eyes: Conjunctivae are normal. PERRL. EOMI. Head: Atraumatic. Nose: No congestion/rhinnorhea. Mouth/Throat: Mucous membranes are moist.  Oropharynx non-erythematous. Neck: No stridor.  No meningeal signs.   Cardiovascular: Normal rate, regular rhythm. Good peripheral circulation. Grossly normal heart sounds.   Respiratory: Normal respiratory effort.  No retractions. Lungs CTAB. Gastrointestinal: Soft and nontender. No distention.  Musculoskeletal: No lower extremity tenderness nor edema. No gross deformities of extremities. Neurologic:  Normal speech and language. Oriented to self, situation, date (off by one day). No gross focal neurologic deficits are appreciated.  Skin:  Skin is warm, dry and intact. No rash noted. ____________________________________________   LABS (all labs ordered are listed, but only abnormal results are displayed)  Labs Reviewed  COMPREHENSIVE METABOLIC PANEL - Abnormal; Notable for the following components:      Result Value   Calcium 7.7 (*)    Total Protein 5.5 (*)    Albumin 3.0 (*)    All other components within normal limits  CBC WITH DIFFERENTIAL/PLATELET - Abnormal; Notable for the following components:   RBC 4.16 (*)    Hemoglobin 12.4 (*)    Platelets 62 (*)    Monocytes Absolute 1.4 (*)    Abs Immature Granulocytes 0.12 (*)    All other components within normal limits  ACETAMINOPHEN LEVEL - Abnormal; Notable for the following components:   Acetaminophen (Tylenol), Serum <10 (*)    All other components within normal limits  URINALYSIS, ROUTINE W REFLEX MICROSCOPIC -  Abnormal; Notable for the following components:   Color, Urine STRAW (*)    Hgb urine dipstick SMALL (*)    All other components within normal limits  I-STAT CHEM 8, ED - Abnormal; Notable for the following components:   Calcium, Ion 1.05 (*)    Hemoglobin 12.9 (*)    HCT 38.0 (*)    All other components within normal limits  ETHANOL  RAPID  URINE DRUG SCREEN, HOSP PERFORMED  SALICYLATE LEVEL  CBG MONITORING, ED   ____________________________________________  EKG   EKG Interpretation  Date/Time:  Wednesday April 17 2018 17:45:46 EST Ventricular Rate:  66 PR Interval:    QRS Duration: 107 QT Interval:  424 QTC Calculation: 445 R Axis:   -9 Text Interpretation:  Sinus rhythm Atrial premature complexes LVH with secondary repolarization abnormality Nonspecific T wave abnormality No previous tracing Confirmed by Lajean Saver [phone]) on 04/17/2018 6:03:54 PM       ____________________________________________  RADIOLOGY  Dg Chest 2 View  Result Date: 04/17/2018 CLINICAL DATA:  Hallucinations for 3 weeks. EXAM: CHEST - 2 VIEW COMPARISON:  Radiographs 03/14/2007. FINDINGS: Lordotic positioning. The heart size and mediastinal contours are stable. There is mild aortic atherosclerosis. There are probable small granulomas at the right lung base. Mild chronic interstitial and septal thickening without edema, confluent airspace opacity, pleural effusion or pneumothorax. IMPRESSION: No active cardiopulmonary process. Electronically Signed   By: Richardean Sale M.D.   On: 04/17/2018 15:01   Ct Head Wo Contrast  Result Date: 04/17/2018 CLINICAL DATA:  Visual hallucinations for 3 weeks. History of hypertension and hypercholesterolemia. EXAM: CT HEAD WITHOUT CONTRAST TECHNIQUE: Contiguous axial images were obtained from the base of the skull through the vertex without intravenous contrast. COMPARISON:  None. FINDINGS: BRAIN: No intraparenchymal hemorrhage, mass effect nor midline shift. The ventricles and sulci are normal for age. Patchy to confluent supratentorial white matter hypodensities. Heterogeneous basal ganglia and thalami associated with chronic small vessel ischemic changes. No acute large vascular territory infarcts. No abnormal extra-axial fluid collections. Basal cisterns are patent. VASCULAR: Moderate calcific  atherosclerosis of the carotid siphons. SKULL: No skull fracture. No significant scalp soft tissue swelling. SINUSES/ORBITS: Mild paranasal sinus mucosal thickening. Minimal LEFT mastoid effusion. Included ocular globes and orbital contents are non-suspicious. Status post bilateral ocular lens implants. OTHER: None. IMPRESSION: 1. No acute intracranial process. 2. Moderate to severe chronic small vessel ischemic changes. Electronically Signed   By: Elon Alas M.D.   On: 04/17/2018 14:47    ____________________________________________   PROCEDURES  Procedure(s) performed:   Procedures   ____________________________________________   INITIAL IMPRESSION / ASSESSMENT AND PLAN / ED COURSE  New hallucinations over the last couple weeks and abnormal behavior and saying abnormal things.  Family states that he will just make comments that do not make any sense about how he got the places and how using it away from places.  Patient states that he sees things but thinks is related to his eyesight more than anything.  Plan for metabolic and infectious work-up.  Also states he has had since he is 84 years old and this could be a neoplastic or some other focal problem in the brain.  If all this is normal think patient can be discharged to follow-up with his primary doctor as he does not seem to be a danger to himself or others at this time.  Could be just related to old age and dementia however does seem pretty well oriented.  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  We will continue to follow with primary doctor if UA is negative.  Care transferred pending UA.    Pertinent labs & imaging results that were available during my care of the patient were reviewed by me and considered in my medical decision making (see chart for details).  ____________________________________________  FINAL CLINICAL  IMPRESSION(S) / ED DIAGNOSES  Final diagnoses:  Altered awareness, transient     MEDICATIONS GIVEN DURING THIS VISIT:  Medications - No data to display   NEW OUTPATIENT MEDICATIONS STARTED DURING THIS VISIT:  Discharge Medication List as of 04/17/2018  6:06 PM      Note:  This note was prepared with assistance of Dragon voice recognition software. Occasional wrong-word or sound-a-like substitutions may have occurred due to the inherent limitations of voice recognition software.   Tzivia Oneil, Corene Cornea, MD 04/18/18 [phone]

## 2018-04-17 NOTE — ED Triage Notes (Signed)
Pt brought in by [facility]- family reports he has had visual hallucinations X3 weeks. No hx of dementia.   CBG 115 130/70 HR- 55

## 2018-04-17 NOTE — ED Provider Notes (Signed)
Signed out by Dr Dayna Barker, that pts mental status is c/w baseline currently, and that if/when UA resulted, to d/c to home.  UA neg for infection.   Pt alert, content, nad. Will d/c per Dr Abner Greenspan plan.        Lajean Saver, MD 04/17/18 [phone]

## 2018-05-02 DIAGNOSIS — Z0100 Encounter for examination of eyes and vision without abnormal findings: Secondary | ICD-10-CM | POA: Diagnosis not present

## 2018-05-02 DIAGNOSIS — H52203 Unspecified astigmatism, bilateral: Secondary | ICD-10-CM | POA: Diagnosis not present

## 2018-07-30 DIAGNOSIS — I351 Nonrheumatic aortic (valve) insufficiency: Secondary | ICD-10-CM | POA: Diagnosis not present

## 2018-07-30 DIAGNOSIS — D692 Other nonthrombocytopenic purpura: Secondary | ICD-10-CM | POA: Diagnosis not present

## 2018-07-30 DIAGNOSIS — I10 Essential (primary) hypertension: Secondary | ICD-10-CM | POA: Diagnosis not present

## 2018-07-30 DIAGNOSIS — M25539 Pain in unspecified wrist: Secondary | ICD-10-CM | POA: Diagnosis not present

## 2018-07-30 DIAGNOSIS — E7849 Other hyperlipidemia: Secondary | ICD-10-CM | POA: Diagnosis not present

## 2018-07-30 DIAGNOSIS — R627 Adult failure to thrive: Secondary | ICD-10-CM | POA: Diagnosis not present

## 2018-07-30 DIAGNOSIS — R443 Hallucinations, unspecified: Secondary | ICD-10-CM | POA: Diagnosis not present

## 2018-07-30 DIAGNOSIS — R279 Unspecified lack of coordination: Secondary | ICD-10-CM | POA: Diagnosis not present

## 2018-07-30 DIAGNOSIS — D696 Thrombocytopenia, unspecified: Secondary | ICD-10-CM | POA: Diagnosis not present

## 2018-08-15 DIAGNOSIS — R443 Hallucinations, unspecified: Secondary | ICD-10-CM | POA: Diagnosis not present

## 2018-10-03 ENCOUNTER — Encounter: Payer: Self-pay | Admitting: *Deleted

## 2018-10-03 ENCOUNTER — Telehealth: Payer: Self-pay | Admitting: *Deleted

## 2018-10-03 NOTE — Telephone Encounter (Signed)
LVM requesting call back to update EMR.  

## 2018-10-07 ENCOUNTER — Other Ambulatory Visit: Payer: Self-pay

## 2018-10-07 ENCOUNTER — Encounter: Payer: Self-pay | Admitting: Diagnostic Neuroimaging

## 2018-10-07 ENCOUNTER — Encounter: Payer: Self-pay | Admitting: *Deleted

## 2018-10-07 ENCOUNTER — Ambulatory Visit (INDEPENDENT_AMBULATORY_CARE_PROVIDER_SITE_OTHER): Payer: Medicare HMO | Admitting: Diagnostic Neuroimaging

## 2018-10-07 DIAGNOSIS — R441 Visual hallucinations: Secondary | ICD-10-CM

## 2018-10-07 NOTE — Telephone Encounter (Signed)
Wife returned call. Updated EMR.

## 2018-10-07 NOTE — Progress Notes (Addendum)
[facility]  PATIENT: [name] DOB: [birth year]  REFERRING CLINICIAN: Virgina Jock HISTORY FROM: patient, daughter x 2, wife  REASON FOR VISIT: new consult    HISTORICAL  CHIEF COMPLAINT:  Chief Complaint  Patient presents with   Hallucinations    HISTORY OF PRESENT ILLNESS:   85 year old male here for evaluation of visual hallucinations.  - visual hallucinations x 6 months (of note there was CT from Nov 2019 for visual hallucinations) - seeing aggressive people at night; small children - having some aggressive behaviors - no auditory hallucinations - no major memory issues - some difficulty with writing checks, handling business issues - now on seroquel 50mg  at bedtime; some benefit; no more aggressive behaviors   REVIEW OF SYSTEMS: Full 14 system review of systems performed and negative with exception of: as per HPI.  ALLERGIES: No Known Allergies  HOME MEDICATIONS: Outpatient Medications Prior to Visit  Medication Sig Dispense Refill   doxazosin (CARDURA) 1 MG tablet Take 1 mg by mouth at bedtime.     latanoprost (XALATAN) 0.005 % ophthalmic solution Place 1 drop into the left eye at bedtime.     QUEtiapine (SEROQUEL) 50 MG tablet 50 mg.     simvastatin (ZOCOR) 40 MG tablet 40 mg daily.     timolol (BETIMOL) 0.5 % ophthalmic solution Place 1 drop into both eyes every morning.     No facility-administered medications prior to visit.     PAST MEDICAL HISTORY: Past Medical History:  Diagnosis Date   Hallucinations    High cholesterol    Hypertension    Memory impairment     PAST SURGICAL HISTORY: Past Surgical History:  Procedure Laterality Date   APPENDECTOMY     SHOULDER SURGERY      FAMILY HISTORY: Family History  Problem Relation Age of Onset   Other Brother        memory issues    SOCIAL HISTORY: Social History   Socioeconomic History   Marital status: Married    Spouse name: Not on file   Number of  children: 2   Years of education: 12   Highest education level: Some college, no degree  Occupational History   Not on file  Social Needs   Financial resource strain: Not on file   Food insecurity:    Worry: Not on file    Inability: Not on file   Transportation needs:    Medical: Not on file    Non-medical: Not on file  Tobacco Use   Smoking status: Never Smoker   Smokeless tobacco: Never Used  Substance and Sexual Activity   Alcohol use: Not Currently    Alcohol/week: 2.0 standard drinks    Types: 2 Glasses of wine per week   Drug use: Never   Sexual activity: Not on file  Lifestyle   Physical activity:    Days per week: Not on file    Minutes per session: Not on file   Stress: Not on file  Relationships   Social connections:    Talks on phone: Not on file    Gets together: Not on file    Attends religious service: Not on file    Active member of club or organization: Not on file    Attends meetings of clubs or organizations: Not on file    Relationship status: Not on file   Intimate partner violence:    Fear of current or ex partner: Not on file    Emotionally abused: Not  on file    Physically abused: Not on file    Forced sexual activity: Not on file  Other Topics Concern   Not on file  Social History Narrative   Lives with wife at home   Caffeine- coffee 1 cup daily     PHYSICAL EXAM  VIDEO EXAM  GENERAL EXAM/CONSTITUTIONAL:  Vitals: There were no vitals filed for this visit.  There is no height or weight on file to calculate BMI. Wt Readings from Last 3 Encounters:  03/07/13 174 lb (78.9 kg)     Patient is in no distress; well developed, nourished and groomed; neck is supple   NEUROLOGIC: MENTAL STATUS:  No flowsheet data found.  awake, alert, oriented to person  remote memory intact  normal attention  language fluent, comprehension intact, naming intact  fund of knowledge appropriate  SLIGHTLY DECR INSIGHT RELATED  TO HALLUCINATIONS  CRANIAL NERVE:   2nd, 3rd, 4th, 6th - visual fields full to confrontation, extraocular muscles intact, no nystagmus  5th - facial sensation symmetric  7th - facial strength symmetric  8th - hearing --> DECR   11th - shoulder shrug symmetric  12th - tongue protrusion midline  MOTOR:   NO TREMOR; NO DRIFT IN BUE  SENSORY:   normal and symmetric to light touch  COORDINATION:   fine finger movements normal      DIAGNOSTIC DATA (LABS, IMAGING, TESTING) - I reviewed patient records, labs, notes, testing and imaging myself where available.  Lab Results  Component Value Date   WBC 4.7 04/17/2018   HGB 12.9 (L) 04/17/2018   HCT 38.0 (L) 04/17/2018   MCV 98.3 04/17/2018   PLT 62 (L) 04/17/2018      Component Value Date/Time   NA 139 04/17/2018 1352   K 3.9 04/17/2018 1352   CL 105 04/17/2018 1352   CO2 25 04/17/2018 1345   GLUCOSE 83 04/17/2018 1352   BUN 18 04/17/2018 1352   CREATININE 0.70 04/17/2018 1352   CALCIUM 7.7 (L) 04/17/2018 1345   PROT 5.5 (L) 04/17/2018 1345   ALBUMIN 3.0 (L) 04/17/2018 1345   AST 18 04/17/2018 1345   ALT 12 04/17/2018 1345   ALKPHOS 44 04/17/2018 1345   BILITOT 0.8 04/17/2018 1345   GFRNONAA >60 04/17/2018 1345   GFRAA >60 04/17/2018 1345   No results found for: CHOL, HDL, LDLCALC, LDLDIRECT, TRIG, CHOLHDL No results found for: HGBA1C No results found for: VITAMINB12 No results found for: TSH   04/17/18 CT head  [I reviewed images myself and agree with interpretation. Moderate-severe perisylvian and mesial temporal atrophy noted. -VRP]  1. No acute intracranial process. 2. Moderate to severe chronic small vessel ischemic changes.    ASSESSMENT AND PLAN  85 y.o. year old male here with decreased hearing, decreased vision, with visual hallucinations and poor insight since November 2020.  Also with some mild cognitive decline, family history of Alzheimer's disease, raising possibility of underlying  neurodegenerative disorder.  Patient has a good supportive environment, living with wife, and 24-hour caregivers.   Dx: suspected neurodegenerative disorder (DLB vs AD)  1. Visual hallucinations      Virtual Visit via Video Note  I connected with Koren Shiver on 10/07/18 at  1:30 PM EDT by a video enabled telemedicine application and verified that I am speaking with the correct person using two identifiers.  Location: Patient: home  Provider: office   I discussed the limitations of evaluation and management by telemedicine and the availability of in person  appointments. The patient expressed understanding and agreed to proceed.  I discussed the assessment and treatment plan with the patient. The patient was provided an opportunity to ask questions and all were answered. The patient agreed with the plan and demonstrated an understanding of the instructions.   The patient was advised to call back or seek an in-person evaluation if the symptoms worsen or if the condition fails to improve as anticipated.  I provided 30 minutes of non-face-to-face time during this encounter.    PLAN:  NEURODEGENERATIVE DEMENTIA - continue seroquel 50mg  at bedtime; may need to increase over time as needed - may consider memantine 10mg  at bedtime; increase to twice a day after 1-2 weeks - safety / supervision issues reviewed - caregiver resources provided - no driving; no handling finances  Return for pending if symptoms worsen or fail to improve, return to PCP.    Penni Bombard, MD 8/34/1962, 2:29 PM Certified in Neurology, Neurophysiology and Neuroimaging  [facility] [address], [place], [place] [address] [phone]

## 2018-10-07 NOTE — Addendum Note (Signed)
Addended by: Minna Antis on: 10/07/2018 10:55 AM   Modules accepted: Orders

## 2018-10-31 ENCOUNTER — Encounter (HOSPITAL_COMMUNITY): Payer: Self-pay | Admitting: Emergency Medicine

## 2018-10-31 ENCOUNTER — Observation Stay (HOSPITAL_COMMUNITY): Payer: Medicare HMO

## 2018-10-31 ENCOUNTER — Emergency Department (HOSPITAL_COMMUNITY): Payer: Medicare HMO

## 2018-10-31 ENCOUNTER — Inpatient Hospital Stay (HOSPITAL_COMMUNITY)
Admission: EM | Admit: 2018-10-31 | Discharge: 2018-11-06 | DRG: 082 | Disposition: A | Discharge status: Hospice | Payer: Medicare HMO | Attending: Internal Medicine | Admitting: Internal Medicine

## 2018-10-31 DIAGNOSIS — I119 Hypertensive heart disease without heart failure: Secondary | ICD-10-CM | POA: Diagnosis present

## 2018-10-31 DIAGNOSIS — J690 Pneumonitis due to inhalation of food and vomit: Secondary | ICD-10-CM | POA: Diagnosis present

## 2018-10-31 DIAGNOSIS — Z515 Encounter for palliative care: Secondary | ICD-10-CM | POA: Diagnosis present

## 2018-10-31 DIAGNOSIS — R413 Other amnesia: Secondary | ICD-10-CM | POA: Diagnosis not present

## 2018-10-31 DIAGNOSIS — R402362 Coma scale, best motor response, obeys commands, at arrival to emergency department: Secondary | ICD-10-CM | POA: Diagnosis present

## 2018-10-31 DIAGNOSIS — R1310 Dysphagia, unspecified: Secondary | ICD-10-CM

## 2018-10-31 DIAGNOSIS — D696 Thrombocytopenia, unspecified: Secondary | ICD-10-CM | POA: Diagnosis not present

## 2018-10-31 DIAGNOSIS — R451 Restlessness and agitation: Secondary | ICD-10-CM

## 2018-10-31 DIAGNOSIS — R4182 Altered mental status, unspecified: Secondary | ICD-10-CM | POA: Diagnosis not present

## 2018-10-31 DIAGNOSIS — E785 Hyperlipidemia, unspecified: Secondary | ICD-10-CM | POA: Diagnosis not present

## 2018-10-31 DIAGNOSIS — Z7189 Other specified counseling: Secondary | ICD-10-CM | POA: Diagnosis not present

## 2018-10-31 DIAGNOSIS — R29810 Facial weakness: Secondary | ICD-10-CM | POA: Diagnosis present

## 2018-10-31 DIAGNOSIS — H4010X0 Unspecified open-angle glaucoma, stage unspecified: Secondary | ICD-10-CM | POA: Diagnosis not present

## 2018-10-31 DIAGNOSIS — N400 Enlarged prostate without lower urinary tract symptoms: Secondary | ICD-10-CM | POA: Diagnosis not present

## 2018-10-31 DIAGNOSIS — R1313 Dysphagia, pharyngeal phase: Secondary | ICD-10-CM | POA: Diagnosis present

## 2018-10-31 DIAGNOSIS — S065X0A Traumatic subdural hemorrhage without loss of consciousness, initial encounter: Secondary | ICD-10-CM | POA: Diagnosis not present

## 2018-10-31 DIAGNOSIS — W19XXXA Unspecified fall, initial encounter: Secondary | ICD-10-CM | POA: Diagnosis present

## 2018-10-31 DIAGNOSIS — R531 Weakness: Secondary | ICD-10-CM | POA: Diagnosis not present

## 2018-10-31 DIAGNOSIS — M47812 Spondylosis without myelopathy or radiculopathy, cervical region: Secondary | ICD-10-CM | POA: Diagnosis not present

## 2018-10-31 DIAGNOSIS — R402252 Coma scale, best verbal response, oriented, at arrival to emergency department: Secondary | ICD-10-CM | POA: Diagnosis present

## 2018-10-31 DIAGNOSIS — I10 Essential (primary) hypertension: Secondary | ICD-10-CM | POA: Diagnosis not present

## 2018-10-31 DIAGNOSIS — H409 Unspecified glaucoma: Secondary | ICD-10-CM | POA: Diagnosis present

## 2018-10-31 DIAGNOSIS — R296 Repeated falls: Secondary | ICD-10-CM | POA: Diagnosis present

## 2018-10-31 DIAGNOSIS — S065X9A Traumatic subdural hemorrhage with loss of consciousness of unspecified duration, initial encounter: Principal | ICD-10-CM | POA: Diagnosis present

## 2018-10-31 DIAGNOSIS — Z03818 Encounter for observation for suspected exposure to other biological agents ruled out: Secondary | ICD-10-CM | POA: Diagnosis not present

## 2018-10-31 DIAGNOSIS — R456 Violent behavior: Secondary | ICD-10-CM | POA: Diagnosis not present

## 2018-10-31 DIAGNOSIS — S065XAA Traumatic subdural hemorrhage with loss of consciousness status unknown, initial encounter: Secondary | ICD-10-CM | POA: Diagnosis present

## 2018-10-31 DIAGNOSIS — Z79899 Other long term (current) drug therapy: Secondary | ICD-10-CM | POA: Diagnosis not present

## 2018-10-31 DIAGNOSIS — R404 Transient alteration of awareness: Secondary | ICD-10-CM | POA: Diagnosis not present

## 2018-10-31 DIAGNOSIS — Z7401 Bed confinement status: Secondary | ICD-10-CM | POA: Diagnosis not present

## 2018-10-31 DIAGNOSIS — I6200 Nontraumatic subdural hemorrhage, unspecified: Secondary | ICD-10-CM | POA: Diagnosis not present

## 2018-10-31 DIAGNOSIS — R29726 NIHSS score 26: Secondary | ICD-10-CM | POA: Diagnosis not present

## 2018-10-31 DIAGNOSIS — I959 Hypotension, unspecified: Secondary | ICD-10-CM | POA: Diagnosis not present

## 2018-10-31 DIAGNOSIS — E7800 Pure hypercholesterolemia, unspecified: Secondary | ICD-10-CM | POA: Diagnosis not present

## 2018-10-31 DIAGNOSIS — F0390 Unspecified dementia without behavioral disturbance: Secondary | ICD-10-CM | POA: Diagnosis not present

## 2018-10-31 DIAGNOSIS — R443 Hallucinations, unspecified: Secondary | ICD-10-CM | POA: Diagnosis not present

## 2018-10-31 DIAGNOSIS — Z1159 Encounter for screening for other viral diseases: Secondary | ICD-10-CM

## 2018-10-31 DIAGNOSIS — G9341 Metabolic encephalopathy: Secondary | ICD-10-CM | POA: Diagnosis not present

## 2018-10-31 DIAGNOSIS — Z66 Do not resuscitate: Secondary | ICD-10-CM | POA: Diagnosis present

## 2018-10-31 DIAGNOSIS — I639 Cerebral infarction, unspecified: Secondary | ICD-10-CM

## 2018-10-31 DIAGNOSIS — M4802 Spinal stenosis, cervical region: Secondary | ICD-10-CM | POA: Diagnosis not present

## 2018-10-31 DIAGNOSIS — M2550 Pain in unspecified joint: Secondary | ICD-10-CM | POA: Diagnosis not present

## 2018-10-31 DIAGNOSIS — I361 Nonrheumatic tricuspid (valve) insufficiency: Secondary | ICD-10-CM | POA: Diagnosis not present

## 2018-10-31 DIAGNOSIS — R402142 Coma scale, eyes open, spontaneous, at arrival to emergency department: Secondary | ICD-10-CM | POA: Diagnosis present

## 2018-10-31 DIAGNOSIS — R0902 Hypoxemia: Secondary | ICD-10-CM | POA: Diagnosis not present

## 2018-10-31 DIAGNOSIS — J90 Pleural effusion, not elsewhere classified: Secondary | ICD-10-CM | POA: Diagnosis not present

## 2018-10-31 HISTORY — DX: Thrombocytopenia, unspecified: D69.6

## 2018-10-31 LAB — RAPID URINE DRUG SCREEN, HOSP PERFORMED
Amphetamines: NOT DETECTED
Barbiturates: NOT DETECTED
Benzodiazepines: NOT DETECTED
Cocaine: NOT DETECTED
Opiates: NOT DETECTED
Tetrahydrocannabinol: NOT DETECTED

## 2018-10-31 LAB — COMPREHENSIVE METABOLIC PANEL
ALT: 10 U/L (ref 0–44)
AST: 20 U/L (ref 15–41)
Albumin: 3.6 g/dL (ref 3.5–5.0)
Alkaline Phosphatase: 57 U/L (ref 38–126)
Anion gap: 9 (ref 5–15)
BUN: 10 mg/dL (ref 8–23)
CO2: 25 mmol/L (ref 22–32)
Calcium: 8.8 mg/dL — ABNORMAL LOW (ref 8.9–10.3)
Chloride: 105 mmol/L (ref 98–111)
Creatinine, Ser: 0.73 mg/dL (ref 0.61–1.24)
GFR calc Af Amer: >60 mL/min (ref >60)
GFR calc non Af Amer: >60 mL/min (ref >60)
Glucose, Bld: 125 mg/dL — ABNORMAL HIGH (ref 70–99)
Potassium: 3.9 mmol/L (ref 3.5–5.1)
Sodium: 139 mmol/L (ref 135–145)
Total Bilirubin: 0.9 mg/dL (ref 0.3–1.2)
Total Protein: 6.5 g/dL (ref 6.5–8.1)

## 2018-10-31 LAB — DIFFERENTIAL
Abs Immature Granulocytes: 0.29 10*3/uL — ABNORMAL HIGH (ref 0.00–0.07)
Basophils Absolute: 0 10*3/uL (ref 0.0–0.1)
Basophils Relative: 0 %
Eosinophils Absolute: 0.1 10*3/uL (ref 0.0–0.5)
Eosinophils Relative: 1 %
Immature Granulocytes: 5 %
Lymphocytes Relative: 13 %
Lymphs Abs: 0.8 10*3/uL (ref 0.7–4.0)
Monocytes Absolute: 1.7 10*3/uL — ABNORMAL HIGH (ref 0.1–1.0)
Monocytes Relative: 29 %
Neutro Abs: 3 10*3/uL (ref 1.7–7.7)
Neutrophils Relative %: 52 %

## 2018-10-31 LAB — APTT: aPTT: 36 seconds (ref 24–36)

## 2018-10-31 LAB — URINALYSIS, ROUTINE W REFLEX MICROSCOPIC
Bilirubin Urine: NEGATIVE
Glucose, UA: NEGATIVE mg/dL
Hgb urine dipstick: NEGATIVE
Ketones, ur: 20 mg/dL — AB
Leukocytes,Ua: NEGATIVE
Nitrite: NEGATIVE
Protein, ur: NEGATIVE mg/dL
Specific Gravity, Urine: 1.012 (ref 1.005–1.030)
pH: 7 (ref 5.0–8.0)

## 2018-10-31 LAB — CBC
HCT: 42.2 % (ref 39.0–52.0)
Hemoglobin: 13.6 g/dL (ref 13.0–17.0)
MCH: 31.2 pg (ref 26.0–34.0)
MCHC: 32.2 g/dL (ref 30.0–36.0)
MCV: 96.8 fL (ref 80.0–100.0)
Platelets: 74 10*3/uL — ABNORMAL LOW (ref 150–400)
RBC: 4.36 MIL/uL (ref 4.22–5.81)
RDW: 14.2 % (ref 11.5–15.5)
WBC: 5.8 10*3/uL (ref 4.0–10.5)
nRBC: 0 % (ref 0.0–0.2)

## 2018-10-31 LAB — I-STAT CHEM 8, ED
BUN: 11 mg/dL (ref 8–23)
Calcium, Ion: 1.08 mmol/L — ABNORMAL LOW (ref 1.15–1.40)
Chloride: 101 mmol/L (ref 98–111)
Creatinine, Ser: 0.7 mg/dL (ref 0.61–1.24)
Glucose, Bld: 117 mg/dL — ABNORMAL HIGH (ref 70–99)
HCT: 43 % (ref 39.0–52.0)
Hemoglobin: 14.6 g/dL (ref 13.0–17.0)
Potassium: 4 mmol/L (ref 3.5–5.1)
Sodium: 137 mmol/L (ref 135–145)
TCO2: 27 mmol/L (ref 22–32)

## 2018-10-31 LAB — PROTIME-INR
INR: 1.2 (ref 0.8–1.2)
Prothrombin Time: 15.2 seconds (ref 11.4–15.2)

## 2018-10-31 MED ORDER — OLOPATADINE HCL 0.1 % OP SOLN
1.0000 [drp] | Freq: Every day | OPHTHALMIC | Status: DC
  Administered 2018-10-31 – 2018-11-05 (×6): 1 [drp] via OPHTHALMIC
  Filled 2018-10-31: qty 5

## 2018-10-31 MED ORDER — ACETAMINOPHEN 650 MG RE SUPP: 650.0000 mg | Freq: Four times a day (QID) | RECTAL | Status: DC | PRN

## 2018-10-31 MED ORDER — GADOBUTROL 1 MMOL/ML IV SOLN
7.0000 mL | Freq: Once | INTRAVENOUS | Status: AC | PRN
  Administered 2018-10-31: 7 mL via INTRAVENOUS

## 2018-10-31 MED ORDER — ACETAMINOPHEN 325 MG PO TABS
650.0000 mg | ORAL_TABLET | Freq: Four times a day (QID) | ORAL | Status: DC | PRN
  Administered 2018-11-03: 650 mg via ORAL
  Filled 2018-10-31: qty 2

## 2018-10-31 MED ORDER — SODIUM CHLORIDE 0.9 % IV SOLN
INTRAVENOUS | Status: AC
  Administered 2018-10-31: 18:00:00 via INTRAVENOUS

## 2018-10-31 MED ORDER — TIMOLOL MALEATE 0.5 % OP SOLN
1.0000 [drp] | Freq: Two times a day (BID) | OPHTHALMIC | Status: DC
  Administered 2018-10-31 – 2018-11-06 (×11): 1 [drp] via OPHTHALMIC
  Filled 2018-10-31: qty 5

## 2018-10-31 MED ORDER — LORAZEPAM 2 MG/ML IJ SOLN
1.0000 mg | Freq: Once | INTRAMUSCULAR | Status: AC
  Administered 2018-10-31: 1 mg via INTRAVENOUS
  Filled 2018-10-31: qty 1

## 2018-10-31 NOTE — ED Triage Notes (Signed)
Pt here from home with c/o stroke symptoms , pt with left side facial drop and left side weakness , unknown lsw

## 2018-10-31 NOTE — ED Provider Notes (Signed)
[facility] EMERGENCY DEPARTMENT Provider Note   CSN: [id number] Arrival date & time: 10/31/18  1049     History   Chief Complaint Chief Complaint  Patient presents with  . Cerebrovascular Accident    HPI [name] is a 85 y.o. male.     85 year old male who presents with strokelike symptoms.  Last seen normal 2 days ago and today's family noted new onset of left-sided facial droop.  Does have a history of dementia and uses a good ambulate normally.  Has had 2 falls within the past several days.  They are witnessed by family.  EMS called and patient transported here.     Past Medical History:  Diagnosis Date  . Hallucinations   . High cholesterol   . Hypertension   . Memory impairment     There are no active problems to display for this patient.   Past Surgical History:  Procedure Laterality Date  . APPENDECTOMY    . SHOULDER SURGERY          Home Medications    Prior to Admission medications   Medication Sig Start Date End Date Taking? Authorizing Provider  doxazosin (CARDURA) 1 MG tablet Take 1 mg by mouth at bedtime.    [provider]  latanoprost (XALATAN) 0.005 % ophthalmic solution Place 1 drop into the left eye at bedtime.    [provider]  QUEtiapine (SEROQUEL) 50 MG tablet 50 mg. 09/26/18   [provider]  simvastatin (ZOCOR) 40 MG tablet 40 mg daily. 09/02/18   [provider]  timolol (BETIMOL) 0.5 % ophthalmic solution Place 1 drop into both eyes every morning.    [provider]    Family History Family History  Problem Relation Age of Onset  . Other Brother        memory issues    Social History Social History   Tobacco Use  . Smoking status: Never Smoker  . Smokeless tobacco: Never Used  Substance Use Topics  . Alcohol use: Not Currently    Alcohol/week: 2.0 standard drinks    Types: 2 Glasses of wine per week  . Drug use: Never     Allergies   Patient has  no known allergies.   Review of Systems Review of Systems  Unable to perform ROS: Dementia     Physical Exam Updated Vital Signs There were no vitals taken for this visit.  Physical Exam Vitals signs and nursing note reviewed.  Constitutional:      General: He is not in acute distress.    Appearance: Normal appearance. He is well-developed. He is not toxic-appearing.  HENT:     Head: Normocephalic and atraumatic.  Eyes:     General: Lids are normal.     Conjunctiva/sclera: Conjunctivae normal.     Pupils: Pupils are equal, round, and reactive to light.  Neck:     Musculoskeletal: Normal range of motion and neck supple.     Thyroid: No thyroid mass.     Trachea: No tracheal deviation.  Cardiovascular:     Rate and Rhythm: Normal rate and regular rhythm.     Heart sounds: Normal heart sounds. No murmur. No gallop.   Pulmonary:     Effort: Pulmonary effort is normal. No respiratory distress.     Breath sounds: Normal breath sounds. No stridor. No decreased breath sounds, wheezing, rhonchi or rales.  Abdominal:     General: Bowel sounds are normal. There is no distension.  Palpations: Abdomen is soft.     Tenderness: There is no abdominal tenderness. There is no rebound.  Musculoskeletal: Normal range of motion.        General: No tenderness.  Skin:    General: Skin is warm and dry.     Findings: No abrasion or rash.  Neurological:     Mental Status: He is lethargic, disoriented and confused.     GCS: GCS eye subscore is 4. GCS verbal subscore is 5. GCS motor subscore is 6.     Cranial Nerves: Facial asymmetry present.     Motor: No tremor.     Comments: Patient withdraws to pain in all 4 extremities.  Left-sided facial droop noted.  Psychiatric:        Attention and Perception: He is inattentive.      ED Treatments / Results  Labs (all labs ordered are listed, but only abnormal results are displayed) Labs Reviewed  ETHANOL  PROTIME-INR  APTT  CBC   DIFFERENTIAL  COMPREHENSIVE METABOLIC PANEL  RAPID URINE DRUG SCREEN, HOSP PERFORMED  URINALYSIS, ROUTINE W REFLEX MICROSCOPIC  I-STAT CHEM 8, ED    EKG EKG Interpretation  Date/Time:  Thursday October 31 2018 10:58:07 EDT Ventricular Rate:  71 PR Interval:    QRS Duration: 106 QT Interval:  417 QTC Calculation: 454 R Axis:   -6 Text Interpretation:  Accelerated junctional rhythm Abnormal R-wave progression, early transition LVH with secondary repolarization abnormality Anterior Q waves, possibly due to LVH Confirmed by Lacretia Leigh ([id number]) on 10/31/2018 1:46:41 PM   Radiology No results found.  Procedures Procedures (including critical care time)  Medications Ordered in ED Medications - No data to display   Initial Impression / Assessment and Plan / ED Course  I have reviewed the triage vital signs and the nursing notes.  Pertinent labs & imaging results that were available during my care of the patient were reviewed by me and considered in my medical decision making (see chart for details).        Patient with evidence of subdural hematoma on head CT and discussed with Dr. Vertell Limber from neurosurgery.  Recommends medical admission he will follow along.  Patient does have subjective left-sided facial droop and will need to have an MRI.  Also medicine for admission  Final Clinical Impressions(s) / ED Diagnoses   Final diagnoses:  None    ED Discharge Orders    None       Lacretia Leigh, MD 10/31/18 1409

## 2018-10-31 NOTE — ED Notes (Addendum)
Yellow socks, yellow (high fall risk) bracelet and bed placed in lowest position.

## 2018-10-31 NOTE — ED Notes (Signed)
Report called to 3W RN.  

## 2018-10-31 NOTE — Progress Notes (Signed)
MRI attempted on patient while in ER.  Pt moving too much-grabbing coil, etc..  Will need meds prior to coming back to MRI.

## 2018-10-31 NOTE — ED Notes (Signed)
Pt returned from MRI, MRI reports they could not do scan due to patient moving so they returned him to the department. Md notified of need for medication orders.

## 2018-10-31 NOTE — ED Notes (Signed)
Daughter at bedside.

## 2018-10-31 NOTE — H&P (Addendum)
TRH H&P    Patient Demographics:    Heinz Eckert, is a 85 y.o. male  MRN: [id number]  DOB - 16-Mar-1921  Admit Date - 10/31/2018  Referring MD/NP/PA: Vivi Martens  Outpatient Primary MD for the patient is Patient, No Pcp Per Dr. Virgina Jock -PCP   Patient coming from:   home  Chief complaint-  Fall,    HPI:    Lavonte Palos  is a 85 y.o. male, w hyperlipidemia, BPH, Glaucoma, ? Dementia, apparently had 2 falls in the past 2 days.  Pt apparently had c/o left sided facial droop for the past 2 days and generalized weakness. Typically walks with walker , but now unable to walk per his daughter.  Didn't seem to use his left hand quite as well.  Pt lives with wife and has caretakers.  Daughter states took aleve for his back last nite. Otherwise no aspirin or blood thinner.     In ED,  T 98 P 78 R 20 Bp 111/68-> 154/84  Pox 97%  Wbc 5.8 Hgb 14.6, Plt 74  PTT 36 INR 1.0  Na 137, K 4.0,  Bun 11, Creatinine 0.70   Neurosurgery consulted, appreciate input, no surgical intervention at this time.  MRI brain recommended  DNR confirmed with daughter  Pt will be admitted for R moderate subdural hematoma,     Review of systems:    In addition to the HPI above,  Unable to obtain clearly due to dementia  No Fever-chills, No Headache, No changes with Vision or hearing, No problems swallowing food or Liquids, No Chest pain, Cough or Shortness of Breath, No Abdominal pain, No Nausea or Vomiting, bowel movements are regular, No Blood in stool or Urine, No dysuria, No new skin rashes or bruises, No new joints pains-aches,  No new weakness, tingling, numbness in any extremity, No recent weight gain or loss, No polyuria, polydypsia or polyphagia, No significant Mental Stressors.  All other systems reviewed and are negative.    Past History of the following :    Past Medical History:  Diagnosis Date  .  Hallucinations   . High cholesterol   . Hypertension   . Memory impairment       Past Surgical History:  Procedure Laterality Date  . APPENDECTOMY    . SHOULDER SURGERY        Social History:      Social History   Tobacco Use  . Smoking status: Never Smoker  . Smokeless tobacco: Never Used  Substance Use Topics  . Alcohol use: Not Currently    Alcohol/week: 2.0 standard drinks    Types: 2 Glasses of wine per week       Family History :     Family History  Problem Relation Age of Onset  . Other Brother        memory issues      Home Medications:   Prior to Admission medications   Medication Sig Start Date End Date Taking? Authorizing Provider  doxazosin (CARDURA) 4 MG tablet Take 2 mg  by mouth daily.    Yes [provider]  Olopatadine HCl (PAZEO) 0.7 % SOLN Place 1 drop into both eyes at bedtime.   Yes [provider]  QUEtiapine (SEROQUEL) 50 MG tablet Take 50 mg by mouth at bedtime.  09/26/18  Yes [provider]  simvastatin (ZOCOR) 40 MG tablet Take 20 mg by mouth daily.  09/02/18  Yes [provider]  Timolol Maleate 0.5 % (DAILY) SOLN Place 1 drop into both eyes 2 (two) times a day.   Yes [provider]     Allergies:    No Known Allergies   Physical Exam:   Vitals  Blood pressure 140/81, pulse 78, temperature 98 F (36.7 C), temperature source Axillary, resp. rate 14, SpO2 97 %.  1.  General: axox1 (person)  2. Psychiatric: euthymic  3. Neurologic: cn2-12 intact, reflexes 2+ symmetric, diffuse with downgoing toes bilaterally, motor 5/5 in all 4 ext, doesn't seem to move left arm quite as well  4. HEENMT:  Anicteric, pupils 1.8mm (left eye appears to have had some ? Cataract surgery), direct, consensual, intact Tongue midline Neck: no  Jvd, no bruit, no tm  5. Respiratory : CTAB  6. Cardiovascular : rrr s1, s2, 2/6 sem rusb/apex  7. Gastrointestinal:  Abd: soft, nt, nd, +bs  8. Skin:   Ext: no c/c/e,  Scrape on forehead  9.Musculoskeletal:  Good ROM,  No adenopathy    Data Review:    CBC Recent Labs  Lab 10/31/18 1057 10/31/18 1119  WBC 5.8  --   HGB 13.6 14.6  HCT 42.2 43.0  PLT 74*  --   MCV 96.8  --   MCH 31.2  --   MCHC 32.2  --   RDW 14.2  --   LYMPHSABS 0.8  --   MONOABS 1.7*  --   EOSABS 0.1  --   BASOSABS 0.0  --    ------------------------------------------------------------------------------------------------------------------  Results for orders placed or performed during the hospital encounter of 10/31/18 (from the past 48 hour(s))  Protime-INR     Status: None   Collection Time: 10/31/18 10:57 AM  Result Value Ref Range   Prothrombin Time 15.2 11.4 - 15.2 seconds   INR 1.2 0.8 - 1.2    Comment: (NOTE) INR goal varies based on device and disease states. Performed at [facility], [place] [address], [place], [place] [address]   APTT     Status: None   Collection Time: 10/31/18 10:57 AM  Result Value Ref Range   aPTT 36 24 - 36 seconds    Comment: Performed at [facility] [address] [place], [place] [address]  CBC     Status: Abnormal   Collection Time: 10/31/18 10:57 AM  Result Value Ref Range   WBC 5.8 4.0 - 10.5 K/uL   RBC 4.36 4.22 - 5.81 MIL/uL   Hemoglobin 13.6 13.0 - 17.0 g/dL   HCT 42.2 39.0 - 52.0 %   MCV 96.8 80.0 - 100.0 fL   MCH 31.2 26.0 - 34.0 pg   MCHC 32.2 30.0 - 36.0 g/dL   RDW 14.2 11.5 - 15.5 %   Platelets 74 (L) 150 - 400 K/uL    Comment: REPEATED TO VERIFY PLATELET COUNT CONFIRMED BY SMEAR Immature Platelet Fraction may be clinically indicated, consider ordering this additional test [id number]    nRBC 0.0 0.0 - 0.2 %    Comment: Performed at [facility], [place] [address],  [place], [place] [address]  Differential     Status: Abnormal   Collection Time: 10/31/18 10:57 AM  Result Value Ref Range   Neutrophils Relative % 52 %   Neutro Abs 3.0 1.7 - 7.7 K/uL   Lymphocytes Relative 13  %   Lymphs Abs 0.8 0.7 - 4.0 K/uL   Monocytes Relative 29 %   Monocytes Absolute 1.7 (H) 0.1 - 1.0 K/uL   Eosinophils Relative 1 %   Eosinophils Absolute 0.1 0.0 - 0.5 K/uL   Basophils Relative 0 %   Basophils Absolute 0.0 0.0 - 0.1 K/uL   Immature Granulocytes 5 %   Abs Immature Granulocytes 0.29 (H) 0.00 - 0.07 K/uL    Comment: Performed at [facility] [address], [place], [place] [address]  Comprehensive metabolic panel     Status: Abnormal   Collection Time: 10/31/18 10:57 AM  Result Value Ref Range   Sodium 139 135 - 145 mmol/L   Potassium 3.9 3.5 - 5.1 mmol/L   Chloride 105 98 - 111 mmol/L   CO2 25 22 - 32 mmol/L   Glucose, Bld 125 (H) 70 - 99 mg/dL   BUN 10 8 - 23 mg/dL   Creatinine, Ser 0.73 0.61 - 1.24 mg/dL   Calcium 8.8 (L) 8.9 - 10.3 mg/dL   Total Protein 6.5 6.5 - 8.1 g/dL   Albumin 3.6 3.5 - 5.0 g/dL   AST 20 15 - 41 U/L   ALT 10 0 - 44 U/L   Alkaline Phosphatase 57 38 - 126 U/L   Total Bilirubin 0.9 0.3 - 1.2 mg/dL   GFR calc non Af Amer >60 >60 mL/min   GFR calc Af Amer >60 >60 mL/min   Anion gap 9 5 - 15    Comment: Performed at [facility] [address], [place], [place] [address]  I-stat chem 8, ED     Status: Abnormal   Collection Time: 10/31/18 11:19 AM  Result Value Ref Range   Sodium 137 135 - 145 mmol/L   Potassium 4.0 3.5 - 5.1 mmol/L   Chloride 101 98 - 111 mmol/L   BUN 11 8 - 23 mg/dL   Creatinine, Ser 0.70 0.61 - 1.24 mg/dL   Glucose, Bld 117 (H) 70 - 99 mg/dL   Calcium, Ion 1.08 (L) 1.15 - 1.40 mmol/L   TCO2 27 22 - 32 mmol/L   Hemoglobin 14.6 13.0 - 17.0 g/dL   HCT 43.0 39.0 - 52.0 %    Chemistries  Recent Labs  Lab 10/31/18 1057 10/31/18 1119  NA 139 137  K 3.9 4.0  CL 105 101  CO2 25  --   GLUCOSE 125* 117*  BUN 10 11  CREATININE 0.73 0.70  CALCIUM 8.8*  --   AST 20  --   ALT 10  --   ALKPHOS 57  --   BILITOT 0.9  --     ------------------------------------------------------------------------------------------------------------------  ------------------------------------------------------------------------------------------------------------------ GFR: CrCl cannot be calculated (Unknown ideal weight.). Liver Function Tests: Recent Labs  Lab 10/31/18 1057  AST 20  ALT 10  ALKPHOS 57  BILITOT 0.9  PROT 6.5  ALBUMIN 3.6   No results for input(s): LIPASE, AMYLASE in the last 168 hours. No results for input(s): AMMONIA in the last 168 hours. Coagulation Profile: Recent Labs  Lab 10/31/18 1057  INR 1.2   Cardiac Enzymes: No results for input(s): CKTOTAL, CKMB, CKMBINDEX, TROPONINI in the last 168 hours. BNP (last 3 results) No results for  input(s): PROBNP in the last 8760 hours. HbA1C: No results for input(s): HGBA1C in the last 72 hours. CBG: No results for input(s): GLUCAP in the last 168 hours. Lipid Profile: No results for input(s): CHOL, HDL, LDLCALC, TRIG, CHOLHDL, LDLDIRECT in the last 72 hours. Thyroid Function Tests: No results for input(s): TSH, T4TOTAL, FREET4, T3FREE, THYROIDAB in the last 72 hours. Anemia Panel: No results for input(s): VITAMINB12, FOLATE, FERRITIN, TIBC, IRON, RETICCTPCT in the last 72 hours.  --------------------------------------------------------------------------------------------------------------- Urine analysis:    Component Value Date/Time   COLORURINE STRAW (A) 04/17/2018 1257   APPEARANCEUR CLEAR 04/17/2018 1257   LABSPEC 1.008 04/17/2018 1257   PHURINE 5.0 04/17/2018 1257   GLUCOSEU NEGATIVE 04/17/2018 1257   HGBUR SMALL (A) 04/17/2018 1257   BILIRUBINUR NEGATIVE 04/17/2018 1257   KETONESUR NEGATIVE 04/17/2018 1257   PROTEINUR NEGATIVE 04/17/2018 1257   NITRITE NEGATIVE 04/17/2018 1257   LEUKOCYTESUR NEGATIVE 04/17/2018 1257      Imaging Results:    Ct Head Wo Contrast  Result Date: 10/31/2018 CLINICAL DATA:  Left-sided facial droop and  left-sided weakness. EXAM: CT HEAD WITHOUT CONTRAST CT CERVICAL SPINE WITHOUT CONTRAST TECHNIQUE: Multidetector CT imaging of the head and cervical spine was performed following the standard protocol without intravenous contrast. Multiplanar CT image reconstructions of the cervical spine were also generated. COMPARISON:  CT scan 04/17/2018 FINDINGS: CT HEAD FINDINGS Brain: There is a moderate to large right-sided subdural hematoma in the right frontal and parietal regions. However, because of the underlying atrophy I do not really see any significant mass effect on the right lateral ventricle. The sulci and not compressed and is no subfalcine herniation. There is chronic underlying atrophy, ventriculomegaly and periventricular white matter disease. No findings for hemispheric infarction or parenchymal hemorrhage. The brainstem and cerebellum are grossly normal in stable. Vascular: Advanced vascular calcifications but no obvious aneurysm or hyperdense vessels. Skull: No skull fracture or bone lesion. Sinuses/Orbits: The paranasal sinuses and mastoid air cells are grossly clear. The globes are intact. Other: No scalp hematoma or laceration. CT CERVICAL SPINE FINDINGS Alignment: Normal overall alignment. There is advanced degenerative cervical spondylosis with multilevel disc disease and facet disease and mild degenerative subluxation of C3. The facets are normally aligned. Skull base and vertebrae: No acute fracture. No primary bone lesion or focal pathologic process. Soft tissues and spinal canal: No prevertebral fluid or swelling. No visible canal hematoma. Disc levels: The spinal canal is generous. No significant spinal stenosis. Mild multilevel foraminal stenosis due to facet disease and uncinate spurring. Upper chest: A right-sided pleural effusion is noted. Calcified granuloma at the right lung apex. Other: No neck mass or adenopathy. Carotid artery calcifications are noted. IMPRESSION: 1. Moderate-sized  right-sided subdural hematoma but without significant mass effect. 2. No CT findings for acute hemispheric infarction or parenchymal hemorrhage. 3. No skull fracture. 4. Degenerative cervical spondylosis but no acute cervical spine fracture. Electronically Signed   By: Marijo Sanes M.D.   On: 10/31/2018 12:27   Ct Cervical Spine Wo Contrast  Result Date: 10/31/2018 CLINICAL DATA:  Left-sided facial droop and left-sided weakness. EXAM: CT HEAD WITHOUT CONTRAST CT CERVICAL SPINE WITHOUT CONTRAST TECHNIQUE: Multidetector CT imaging of the head and cervical spine was performed following the standard protocol without intravenous contrast. Multiplanar CT image reconstructions of the cervical spine were also generated. COMPARISON:  CT scan 04/17/2018 FINDINGS: CT HEAD FINDINGS Brain: There is a moderate to large right-sided subdural hematoma in the right frontal and parietal regions. However, because of the underlying  atrophy I do not really see any significant mass effect on the right lateral ventricle. The sulci and not compressed and is no subfalcine herniation. There is chronic underlying atrophy, ventriculomegaly and periventricular white matter disease. No findings for hemispheric infarction or parenchymal hemorrhage. The brainstem and cerebellum are grossly normal in stable. Vascular: Advanced vascular calcifications but no obvious aneurysm or hyperdense vessels. Skull: No skull fracture or bone lesion. Sinuses/Orbits: The paranasal sinuses and mastoid air cells are grossly clear. The globes are intact. Other: No scalp hematoma or laceration. CT CERVICAL SPINE FINDINGS Alignment: Normal overall alignment. There is advanced degenerative cervical spondylosis with multilevel disc disease and facet disease and mild degenerative subluxation of C3. The facets are normally aligned. Skull base and vertebrae: No acute fracture. No primary bone lesion or focal pathologic process. Soft tissues and spinal canal: No  prevertebral fluid or swelling. No visible canal hematoma. Disc levels: The spinal canal is generous. No significant spinal stenosis. Mild multilevel foraminal stenosis due to facet disease and uncinate spurring. Upper chest: A right-sided pleural effusion is noted. Calcified granuloma at the right lung apex. Other: No neck mass or adenopathy. Carotid artery calcifications are noted. IMPRESSION: 1. Moderate-sized right-sided subdural hematoma but without significant mass effect. 2. No CT findings for acute hemispheric infarction or parenchymal hemorrhage. 3. No skull fracture. 4. Degenerative cervical spondylosis but no acute cervical spine fracture. Electronically Signed   By: Marijo Sanes M.D.   On: 10/31/2018 12:27   ekg nsr at 70, nl axis, nl int, Q in v1,v2,  early R progression , T inversion in v3-5    Assessment & Plan:    Active Problems:   Subdural hematoma (HCC)   Thrombocytopenia (HCC)  Subdural hematoma, moderate, R side Daughter notes he took Aleve last nite, otherwise no aspirin, no blood thinner MRI brain Consider further w/up with carotid ultrasound if evidence of stroke NPO Hydrate with ns iv  Speech therapy to evaluate swallowing Neurosurgery consulted , appreciate input  Thrombocytopenia (chronic), follows with pcp Check cbc in am  Cardiac murmer Check cardiac echo  BPH Cont Cardura 2mg  po qday if passes speech therapy  Hyperlipidemia Cont Simvastatin 20mg  po qday if passes speech therapy  Glaucoma Cont Timolol  Memory disorder ? Dementia Hold Seroquel for now  DVT Prophylaxis-   SCDs   AM Labs Ordered, also please review Full Orders  Family Communication: Admission, patients condition and plan of care including tests being ordered have been discussed with the patient's daughter who indicate understanding and agree with the plan and Code Status.  Code Status:  DNR  Admission status: Observation: Based on patients clinical presentation and evaluation  of above clinical data, I have made determination that patient meets observation criteria at this time.  Time spent in minutes :  55 minutes   Jani Gravel M.D on 10/31/2018 at 2:34 PM

## 2018-10-31 NOTE — ED Notes (Signed)
Nurse Navigator communication: Spoke with daughter [name] who was given an update. Per primary RN she is allowed to be present at bedside, she will be coming to the ED. She appreciated the communication.

## 2018-10-31 NOTE — Consult Note (Signed)
Reason for Consult:SDH Referring Physician: [name] is an 85 y.o. male.  HPI:  85 year old male who presents with strokelike symptoms.  Last seen normal 2 days ago and today's family noted new onset of left-sided facial droop.  History of dementia and uses a cane to ambulate.  Has had 2 falls within the past several days.  They are witnessed by family.  EMS called and patient transported here.   Past Medical History:  Diagnosis Date  . Hallucinations   . High cholesterol   . Hypertension   . Memory impairment     Past Surgical History:  Procedure Laterality Date  . APPENDECTOMY    . SHOULDER SURGERY      Family History  Problem Relation Age of Onset  . Other Brother        memory issues    Social History:  reports that he has never smoked. He has never used smokeless tobacco. He reports previous alcohol use of about 2.0 standard drinks of alcohol per week. He reports that he does not use drugs.  Allergies: No Known Allergies  Medications: I have reviewed the patient's current medications.  Results for orders placed or performed during the hospital encounter of 10/31/18 (from the past 48 hour(s))  Protime-INR     Status: None   Collection Time: 10/31/18 10:57 AM  Result Value Ref Range   Prothrombin Time 15.2 11.4 - 15.2 seconds   INR 1.2 0.8 - 1.2    Comment: (NOTE) INR goal varies based on device and disease states. Performed at [facility], [place] [address] [place], [place] [address]   APTT     Status: None   Collection Time: 10/31/18 10:57 AM  Result Value Ref Range   aPTT 36 24 - 36 seconds    Comment: Performed at [facility] [address], [place], [place] [address]  CBC     Status: Abnormal   Collection Time: 10/31/18 10:57 AM  Result Value Ref Range   WBC 5.8 4.0 - 10.5 K/uL   RBC 4.36 4.22 - 5.81 MIL/uL   Hemoglobin 13.6 13.0 - 17.0 g/dL   HCT 42.2 39.0 - 52.0 %   MCV 96.8 80.0 - 100.0 fL   MCH 31.2 26.0 - 34.0 pg    MCHC 32.2 30.0 - 36.0 g/dL   RDW 14.2 11.5 - 15.5 %   Platelets 74 (L) 150 - 400 K/uL    Comment: REPEATED TO VERIFY PLATELET COUNT CONFIRMED BY SMEAR Immature Platelet Fraction may be clinically indicated, consider ordering this additional test [id number]    nRBC 0.0 0.0 - 0.2 %    Comment: Performed at [facility], [place] [address] [place], [place] [address]  Differential     Status: Abnormal   Collection Time: 10/31/18 10:57 AM  Result Value Ref Range   Neutrophils Relative % 52 %   Neutro Abs 3.0 1.7 - 7.7 K/uL   Lymphocytes Relative 13 %   Lymphs Abs 0.8 0.7 - 4.0 K/uL   Monocytes Relative 29 %   Monocytes Absolute 1.7 (H) 0.1 - 1.0 K/uL   Eosinophils Relative 1 %   Eosinophils Absolute 0.1 0.0 - 0.5 K/uL   Basophils Relative 0 %   Basophils Absolute 0.0 0.0 - 0.1 K/uL   Immature Granulocytes 5 %   Abs Immature Granulocytes 0.29 (H) 0.00 - 0.07 K/uL    Comment: Performed at [facility] [address]  [place], [place] [address]  Comprehensive metabolic panel     Status: Abnormal   Collection Time: 10/31/18 10:57 AM  Result Value Ref Range   Sodium 139 135 - 145 mmol/L   Potassium 3.9 3.5 - 5.1 mmol/L   Chloride 105 98 - 111 mmol/L   CO2 25 22 - 32 mmol/L   Glucose, Bld 125 (H) 70 - 99 mg/dL   BUN 10 8 - 23 mg/dL   Creatinine, Ser 0.73 0.61 - 1.24 mg/dL   Calcium 8.8 (L) 8.9 - 10.3 mg/dL   Total Protein 6.5 6.5 - 8.1 g/dL   Albumin 3.6 3.5 - 5.0 g/dL   AST 20 15 - 41 U/L   ALT 10 0 - 44 U/L   Alkaline Phosphatase 57 38 - 126 U/L   Total Bilirubin 0.9 0.3 - 1.2 mg/dL   GFR calc non Af Amer >60 >60 mL/min   GFR calc Af Amer >60 >60 mL/min   Anion gap 9 5 - 15    Comment: Performed at [facility] [address], [place], [place] [address]  I-stat chem 8, ED     Status: Abnormal   Collection Time: 10/31/18 11:19 AM  Result Value Ref Range   Sodium 137 135 - 145 mmol/L   Potassium 4.0 3.5 - 5.1 mmol/L   Chloride 101 98 - 111 mmol/L   BUN 11 8  - 23 mg/dL   Creatinine, Ser 0.70 0.61 - 1.24 mg/dL   Glucose, Bld 117 (H) 70 - 99 mg/dL   Calcium, Ion 1.08 (L) 1.15 - 1.40 mmol/L   TCO2 27 22 - 32 mmol/L   Hemoglobin 14.6 13.0 - 17.0 g/dL   HCT 43.0 39.0 - 52.0 %    Ct Head Wo Contrast  Result Date: 10/31/2018 CLINICAL DATA:  Left-sided facial droop and left-sided weakness. EXAM: CT HEAD WITHOUT CONTRAST CT CERVICAL SPINE WITHOUT CONTRAST TECHNIQUE: Multidetector CT imaging of the head and cervical spine was performed following the standard protocol without intravenous contrast. Multiplanar CT image reconstructions of the cervical spine were also generated. COMPARISON:  CT scan 04/17/2018 FINDINGS: CT HEAD FINDINGS Brain: There is a moderate to large right-sided subdural hematoma in the right frontal and parietal regions. However, because of the underlying atrophy I do not really see any significant mass effect on the right lateral ventricle. The sulci and not compressed and is no subfalcine herniation. There is chronic underlying atrophy, ventriculomegaly and periventricular white matter disease. No findings for hemispheric infarction or parenchymal hemorrhage. The brainstem and cerebellum are grossly normal in stable. Vascular: Advanced vascular calcifications but no obvious aneurysm or hyperdense vessels. Skull: No skull fracture or bone lesion. Sinuses/Orbits: The paranasal sinuses and mastoid air cells are grossly clear. The globes are intact. Other: No scalp hematoma or laceration. CT CERVICAL SPINE FINDINGS Alignment: Normal overall alignment. There is advanced degenerative cervical spondylosis with multilevel disc disease and facet disease and mild degenerative subluxation of C3. The facets are normally aligned. Skull base and vertebrae: No acute fracture. No primary bone lesion or focal pathologic process. Soft tissues and spinal canal: No prevertebral fluid or swelling. No visible canal hematoma. Disc levels: The spinal canal is generous. No  significant spinal stenosis. Mild multilevel foraminal stenosis due to facet disease and uncinate spurring. Upper chest: A right-sided pleural effusion is noted. Calcified granuloma at the right lung apex. Other: No neck mass or adenopathy. Carotid artery calcifications are noted. IMPRESSION: 1. Moderate-sized right-sided subdural hematoma but without significant mass  effect. 2. No CT findings for acute hemispheric infarction or parenchymal hemorrhage. 3. No skull fracture. 4. Degenerative cervical spondylosis but no acute cervical spine fracture. Electronically Signed   By: Marijo Sanes M.D.   On: 10/31/2018 12:27   Ct Cervical Spine Wo Contrast  Result Date: 10/31/2018 CLINICAL DATA:  Left-sided facial droop and left-sided weakness. EXAM: CT HEAD WITHOUT CONTRAST CT CERVICAL SPINE WITHOUT CONTRAST TECHNIQUE: Multidetector CT imaging of the head and cervical spine was performed following the standard protocol without intravenous contrast. Multiplanar CT image reconstructions of the cervical spine were also generated. COMPARISON:  CT scan 04/17/2018 FINDINGS: CT HEAD FINDINGS Brain: There is a moderate to large right-sided subdural hematoma in the right frontal and parietal regions. However, because of the underlying atrophy I do not really see any significant mass effect on the right lateral ventricle. The sulci and not compressed and is no subfalcine herniation. There is chronic underlying atrophy, ventriculomegaly and periventricular white matter disease. No findings for hemispheric infarction or parenchymal hemorrhage. The brainstem and cerebellum are grossly normal in stable. Vascular: Advanced vascular calcifications but no obvious aneurysm or hyperdense vessels. Skull: No skull fracture or bone lesion. Sinuses/Orbits: The paranasal sinuses and mastoid air cells are grossly clear. The globes are intact. Other: No scalp hematoma or laceration. CT CERVICAL SPINE FINDINGS Alignment: Normal overall alignment.  There is advanced degenerative cervical spondylosis with multilevel disc disease and facet disease and mild degenerative subluxation of C3. The facets are normally aligned. Skull base and vertebrae: No acute fracture. No primary bone lesion or focal pathologic process. Soft tissues and spinal canal: No prevertebral fluid or swelling. No visible canal hematoma. Disc levels: The spinal canal is generous. No significant spinal stenosis. Mild multilevel foraminal stenosis due to facet disease and uncinate spurring. Upper chest: A right-sided pleural effusion is noted. Calcified granuloma at the right lung apex. Other: No neck mass or adenopathy. Carotid artery calcifications are noted. IMPRESSION: 1. Moderate-sized right-sided subdural hematoma but without significant mass effect. 2. No CT findings for acute hemispheric infarction or parenchymal hemorrhage. 3. No skull fracture. 4. Degenerative cervical spondylosis but no acute cervical spine fracture. Electronically Signed   By: Marijo Sanes M.D.   On: 10/31/2018 12:27    Review of Systems - Negative except Per HPI    Blood pressure 140/81, pulse 78, temperature 98 F (36.7 C), temperature source Axillary, resp. rate 14, SpO2 97 %. Physical Exam  Assessment/Plan: Patient is 85 year old man with falling and facial droop with dementia and brain volume loss.  He has a small right frontal convexity SDH.  This is causing local mass effect, but no significant shift.  I agree with Dr. Zenia Resides with proceeding with brain MRI to rule out stroke.  I do not think he requires surgery for this SDH and would not be in a hurry to take a 85 year old with dementia to evacuate this subdural hematoma.  Juan Shoals, MD 10/31/2018, 2:05 PM

## 2018-11-01 ENCOUNTER — Other Ambulatory Visit (HOSPITAL_COMMUNITY): Payer: Medicare HMO

## 2018-11-01 ENCOUNTER — Inpatient Hospital Stay (HOSPITAL_COMMUNITY): Payer: Medicare HMO

## 2018-11-01 DIAGNOSIS — R413 Other amnesia: Secondary | ICD-10-CM | POA: Diagnosis not present

## 2018-11-01 DIAGNOSIS — R443 Hallucinations, unspecified: Secondary | ICD-10-CM | POA: Diagnosis present

## 2018-11-01 DIAGNOSIS — R1310 Dysphagia, unspecified: Secondary | ICD-10-CM | POA: Diagnosis not present

## 2018-11-01 DIAGNOSIS — I361 Nonrheumatic tricuspid (valve) insufficiency: Secondary | ICD-10-CM | POA: Diagnosis not present

## 2018-11-01 DIAGNOSIS — Z66 Do not resuscitate: Secondary | ICD-10-CM | POA: Diagnosis present

## 2018-11-01 DIAGNOSIS — G9341 Metabolic encephalopathy: Secondary | ICD-10-CM | POA: Diagnosis present

## 2018-11-01 DIAGNOSIS — I119 Hypertensive heart disease without heart failure: Secondary | ICD-10-CM | POA: Diagnosis present

## 2018-11-01 DIAGNOSIS — S065X9A Traumatic subdural hemorrhage with loss of consciousness of unspecified duration, initial encounter: Secondary | ICD-10-CM | POA: Diagnosis present

## 2018-11-01 DIAGNOSIS — H409 Unspecified glaucoma: Secondary | ICD-10-CM | POA: Diagnosis present

## 2018-11-01 DIAGNOSIS — R451 Restlessness and agitation: Secondary | ICD-10-CM | POA: Diagnosis not present

## 2018-11-01 DIAGNOSIS — H4010X0 Unspecified open-angle glaucoma, stage unspecified: Secondary | ICD-10-CM | POA: Diagnosis not present

## 2018-11-01 DIAGNOSIS — Z79899 Other long term (current) drug therapy: Secondary | ICD-10-CM | POA: Diagnosis not present

## 2018-11-01 DIAGNOSIS — W19XXXA Unspecified fall, initial encounter: Secondary | ICD-10-CM | POA: Diagnosis present

## 2018-11-01 DIAGNOSIS — J690 Pneumonitis due to inhalation of food and vomit: Secondary | ICD-10-CM | POA: Diagnosis present

## 2018-11-01 DIAGNOSIS — I639 Cerebral infarction, unspecified: Secondary | ICD-10-CM | POA: Diagnosis not present

## 2018-11-01 DIAGNOSIS — R29810 Facial weakness: Secondary | ICD-10-CM | POA: Diagnosis present

## 2018-11-01 DIAGNOSIS — E785 Hyperlipidemia, unspecified: Secondary | ICD-10-CM | POA: Diagnosis present

## 2018-11-01 DIAGNOSIS — N400 Enlarged prostate without lower urinary tract symptoms: Secondary | ICD-10-CM | POA: Diagnosis present

## 2018-11-01 DIAGNOSIS — I10 Essential (primary) hypertension: Secondary | ICD-10-CM | POA: Diagnosis present

## 2018-11-01 DIAGNOSIS — R296 Repeated falls: Secondary | ICD-10-CM | POA: Diagnosis present

## 2018-11-01 DIAGNOSIS — Z515 Encounter for palliative care: Secondary | ICD-10-CM | POA: Diagnosis present

## 2018-11-01 DIAGNOSIS — Z7189 Other specified counseling: Secondary | ICD-10-CM | POA: Diagnosis not present

## 2018-11-01 DIAGNOSIS — D696 Thrombocytopenia, unspecified: Secondary | ICD-10-CM | POA: Diagnosis present

## 2018-11-01 DIAGNOSIS — Z1159 Encounter for screening for other viral diseases: Secondary | ICD-10-CM | POA: Diagnosis not present

## 2018-11-01 DIAGNOSIS — R1313 Dysphagia, pharyngeal phase: Secondary | ICD-10-CM | POA: Diagnosis present

## 2018-11-01 DIAGNOSIS — F0390 Unspecified dementia without behavioral disturbance: Secondary | ICD-10-CM | POA: Diagnosis present

## 2018-11-01 LAB — COMPREHENSIVE METABOLIC PANEL
ALT: 10 U/L (ref 0–44)
AST: 21 U/L (ref 15–41)
Albumin: 3.1 g/dL — ABNORMAL LOW (ref 3.5–5.0)
Alkaline Phosphatase: 53 U/L (ref 38–126)
Anion gap: 8 (ref 5–15)
BUN: 9 mg/dL (ref 8–23)
CO2: 25 mmol/L (ref 22–32)
Calcium: 8.3 mg/dL — ABNORMAL LOW (ref 8.9–10.3)
Chloride: 105 mmol/L (ref 98–111)
Creatinine, Ser: 0.69 mg/dL (ref 0.61–1.24)
GFR calc Af Amer: >60 mL/min (ref >60)
GFR calc non Af Amer: >60 mL/min (ref >60)
Glucose, Bld: 89 mg/dL (ref 70–99)
Potassium: 4 mmol/L (ref 3.5–5.1)
Sodium: 138 mmol/L (ref 135–145)
Total Bilirubin: 1.1 mg/dL (ref 0.3–1.2)
Total Protein: 6.2 g/dL — ABNORMAL LOW (ref 6.5–8.1)

## 2018-11-01 LAB — CBC
HCT: 41.4 % (ref 39.0–52.0)
Hemoglobin: 13.3 g/dL (ref 13.0–17.0)
MCH: 30.6 pg (ref 26.0–34.0)
MCHC: 32.1 g/dL (ref 30.0–36.0)
MCV: 95.4 fL (ref 80.0–100.0)
Platelets: 67 10*3/uL — ABNORMAL LOW (ref 150–400)
RBC: 4.34 MIL/uL (ref 4.22–5.81)
RDW: 14.2 % (ref 11.5–15.5)
WBC: 8 10*3/uL (ref 4.0–10.5)
nRBC: 0 % (ref 0.0–0.2)

## 2018-11-01 LAB — ECHOCARDIOGRAM COMPLETE: Weight: 2610.25 oz

## 2018-11-01 LAB — NOVEL CORONAVIRUS, NAA (HOSP ORDER, SEND-OUT TO REF LAB; TAT 18-24 HRS): SARS-CoV-2, NAA: NOT DETECTED

## 2018-11-01 MED ORDER — STROKE: EARLY STAGES OF RECOVERY BOOK
Freq: Once | Status: DC
  Filled 2018-11-01: qty 1

## 2018-11-01 NOTE — Evaluation (Signed)
Clinical/Bedside Swallow Evaluation Patient Details  Name: [name] MRN: [id number] Date of Birth: Oct 18, 1920  Today's Date: 11/01/2018 Time: SLP Start Time (ACUTE ONLY): 3329 SLP Stop Time (ACUTE ONLY): 0710 SLP Time Calculation (min) (ACUTE ONLY): 17 min  Past Medical History:  Past Medical History:  Diagnosis Date  . Hallucinations   . High cholesterol   . Hypertension   . Memory impairment   . Thrombocytopenia ([place])    Past Surgical History:  Past Surgical History:  Procedure Laterality Date  . APPENDECTOMY    . SHOULDER SURGERY     HPI:  [name]  is a 85 y.o. male, w hyperlipidemia, BPH, Glaucoma, ? Dementia, apparently had 2 falls in the past 2 days.  Pt apparently had c/o left sided facial droop for the past 2 days and generalized weakness. Typically walks with walker , but now unable to walk per his daughter.  Didn't seem to use his left hand quite as well.  Pt lives with wife and has caretakers.  MRI revealed Right hemispheric subdural hematoma measuring up to 16 mm in thickness without midline shift or other mass effect, punctate focus of subacute ischemia within the left thalamus, no other recent ischemia and generalized volume loss and chronic ischemic microangiopathy.    Assessment / Plan / Recommendation Clinical Impression  Pt presents at high risk of aspiration given current mentation. Pt was able to arouse but unable to open eyes or actively participate. Pt's daughter present in room and attempted to stimulate pt but had no more success than this Probation officer. As part of stimulation, an ice chips was placed at pt's lips. He demonstrated appropriate perception of ice chips and moved it around in his mouth. After swallow pt with wet cough possibly indicative of delayed swallow initiation and decreased airway protection. To facilitate more natural presentation, cup was placed in pt's hand and he was able to minimally bring to his mouth but didn't initiate drinking from  cup. At this point, pt should remain NPO d/t mental status. ST to follow for PO readiness. Education provided to pt's nurse and pt's daughter on continuation of NPO status.  SLP Visit Diagnosis: Dysphagia, unspecified (R13.10)    Aspiration Risk  Severe aspiration risk    Diet Recommendation NPO   Medication Administration: Via alternative means    Other  Recommendations Oral Care Recommendations: Oral care QID   Follow up Recommendations (TBD)      Frequency and Duration min 2x/week  2 weeks       Prognosis Prognosis for Safe Diet Advancement: Fair Barriers to Reach Goals: Cognitive deficits      Swallow Study   General Date of Onset: 10/31/18 HPI: [name]  is a 85 y.o. male, w hyperlipidemia, BPH, Glaucoma, ? Dementia, apparently had 2 falls in the past 2 days.  Pt apparently had c/o left sided facial droop for the past 2 days and generalized weakness. Typically walks with walker , but now unable to walk per his daughter.  Didn't seem to use his left hand quite as well.  Pt lives with wife and has caretakers.  MRI revealed Right hemispheric subdural hematoma measuring up to 16 mm in thickness without midline shift or other mass effect, punctate focus of subacute ischemia within the left thalamus, no other recent ischemia and generalized volume loss and chronic ischemic microangiopathy.  Type of Study: Bedside Swallow Evaluation Previous Swallow Assessment: none in chart Diet Prior to this Study: NPO Temperature Spikes Noted: No Respiratory  Status: Room air History of Recent Intubation: No Behavior/Cognition: Lethargic/Drowsy;Doesn't follow directions Oral Cavity Assessment: Within Functional Limits Oral Care Completed by SLP: Recent completion by staff Oral Cavity - Dentition: Poor condition Self-Feeding Abilities: Total assist Patient Positioning: Upright in bed Baseline Vocal Quality: Not observed Volitional Cough: Cognitively unable to elicit Volitional Swallow:  Unable to elicit    Oral/Motor/Sensory Function Overall Oral Motor/Sensory Function: (unable to assess)   Ice Chips Ice chips: Impaired Oral Phase Impairments: Poor awareness of bolus Pharyngeal Phase Impairments: Cough - Immediate   Thin Liquid Thin Liquid: Not tested    Nectar Thick Nectar Thick Liquid: Not tested   Honey Thick Honey Thick Liquid: Not tested   Puree Puree: Not tested   Solid     Solid: Not tested      [name] 11/01/2018,9:32 AM

## 2018-11-01 NOTE — Plan of Care (Signed)

## 2018-11-01 NOTE — Plan of Care (Signed)
Progressing towards goals

## 2018-11-01 NOTE — Progress Notes (Signed)
  2D Echocardiogram has been performed.  [name] 11/01/2018, 2:34 PM

## 2018-11-01 NOTE — Progress Notes (Signed)
[name] Kitchen  PROGRESS NOTE    [name]  MOQ:[id number] DOB: 1920/11/22 DOA: 10/31/2018 PCP: Patient, No Pcp Per   Brief Narrative:    [name]  is a 85 y.o. male, w hyperlipidemia, BPH, Glaucoma, ? Dementia, apparently had 2 falls in the past 2 days.  Pt apparently had c/o left sided facial droop for the past 2 days and generalized weakness. Typically walks with walker , but now unable to walk per his daughter.  Didn't seem to use his left hand quite as well.  Pt lives with wife and has caretakers.  Daughter states took aleve for his back last nite. Otherwise no aspirin or blood thinner.     Assessment & Plan:   Active Problems:   Subdural hematoma (HCC)   Thrombocytopenia (HCC)   Subdural hematoma, moderate, R side     - Daughter notes he took Aleve last nite, otherwise no aspirin, no blood thinner     - MRI brain: 1. Right hemispheric subdural hematoma measuring up to 16 mm in thickness without midline shift or other mass effect. 2. Punctate focus of subacute ischemia within the left thalamus. No other recent ischemia.     - NPO      - neurosurgery onboard, awaiting final recs  Subacute stroke     - MRI brain: Punctate focus left thalamic subacute ischemia     - family reports that he had an inital fall about 2 weeks ago     - Spoke with neuro; unable to start ASA at this time d/t bleed; no need for carotids; initally therapy evals     - can start ASA once cleared by neurosurg  Thrombocytopenia (chronic)     - follows with pcp     - SCDs for DVT PPx  Cardiac murmr     - Echo results pending  BPH     - Cont Cardura 2mg  po qday if passes speech therapy  Hyperlipidemia     - Cont Simvastatin 20mg  po qday if passes speech therapy  Glaucoma     - Cont Timolol  Memory disorder ? Dementia     - Hold Seroquel for now   DVT prophylaxis: SCD Code Status: DNR Family Communication: Spoke w/ dtr in room   Disposition Plan: TBD   Consultants:   Neurosurgery     Subjective: No acute events ON per staff  Objective: Vitals:   11/01/18 0426 11/01/18 0500 11/01/18 0908 11/01/18 1204  BP: (!) 154/81  132/74 124/67  Pulse: 65  62 62  Resp: 16  16 16   Temp: 97.9 F (36.6 C)  98 F (36.7 C) 98 F (36.7 C)  TempSrc: Oral  Axillary Oral  SpO2: 95%  100% 100%  Weight:  74 kg      Intake/Output Summary (Last 24 hours) at 11/01/2018 1349 Last data filed at 11/01/2018 0430 Gross per 24 hour  Intake 450 ml  Output 600 ml  Net -150 ml   Filed Weights   11/01/18 0500  Weight: 74 kg    Examination:  General: 85 y.o. male resting in bed in NAD Cardiovascular: RRR, +S1, S2, no m/g/r, equal pulses throughout Respiratory: CTABL, no w/r/r, normal WOB GI: BS+, NDNT, no masses noted, no organomegaly noted MSK: No e/c/c Skin: No rashes, ulcerations noted; right forehead bruising noted Neuro: somnolent     Data Reviewed: I have personally reviewed following labs and imaging studies.  CBC: Recent Labs  Lab 10/31/18 1057 10/31/18 1119 11/01/18 0913  WBC 5.8  --  8.0  NEUTROABS 3.0  --   --   HGB 13.6 14.6 13.3  HCT 42.2 43.0 41.4  MCV 96.8  --  95.4  PLT 74*  --  67*   Basic Metabolic Panel: Recent Labs  Lab 10/31/18 1057 10/31/18 1119 11/01/18 0913  NA 139 137 138  K 3.9 4.0 4.0  CL 105 101 105  CO2 25  --  25  GLUCOSE 125* 117* 89  BUN 10 11 9   CREATININE 0.73 0.70 0.69  CALCIUM 8.8*  --  8.3*   GFR: CrCl cannot be calculated (Unknown ideal weight.). Liver Function Tests: Recent Labs  Lab 10/31/18 1057 11/01/18 0913  AST 20 21  ALT 10 10  ALKPHOS 57 53  BILITOT 0.9 1.1  PROT 6.5 6.2*  ALBUMIN 3.6 3.1*   No results for input(s): LIPASE, AMYLASE in the last 168 hours. No results for input(s): AMMONIA in the last 168 hours. Coagulation Profile: Recent Labs  Lab 10/31/18 1057  INR 1.2   Cardiac Enzymes: No results for input(s): CKTOTAL, CKMB, CKMBINDEX, TROPONINI in the last 168 hours. BNP (last 3  results) No results for input(s): PROBNP in the last 8760 hours. HbA1C: No results for input(s): HGBA1C in the last 72 hours. CBG: No results for input(s): GLUCAP in the last 168 hours. Lipid Profile: No results for input(s): CHOL, HDL, LDLCALC, TRIG, CHOLHDL, LDLDIRECT in the last 72 hours. Thyroid Function Tests: No results for input(s): TSH, T4TOTAL, FREET4, T3FREE, THYROIDAB in the last 72 hours. Anemia Panel: No results for input(s): VITAMINB12, FOLATE, FERRITIN, TIBC, IRON, RETICCTPCT in the last 72 hours. Sepsis Labs: No results for input(s): PROCALCITON, LATICACIDVEN in the last 168 hours.  Recent Results (from the past 240 hour(s))  Novel Coronavirus,NAA,(SEND-OUT TO REF LAB - TAT 24-48 hrs); Hosp Order     Status: None   Collection Time: 10/31/18  1:56 PM   Specimen: Nasopharyngeal Swab; Respiratory  Result Value Ref Range Status   SARS-CoV-2, NAA NOT DETECTED NOT DETECTED Final    Comment: (NOTE) This test was developed and its performance characteristics determined by [employer]. This test has not been FDA cleared or approved. This test has been authorized by FDA under an Emergency Use Authorization (EUA). This test is only authorized for the duration of time the declaration that circumstances exist justifying the authorization of the emergency use of in vitro diagnostic tests for detection of SARS-CoV-2 virus and/or diagnosis of COVID-19 infection under section 564(b)(1) of the Act, 21 U.S.C. 161WRU-0(A)(5), unless the authorization is terminated or revoked sooner. When diagnostic testing is negative, the possibility of a false negative result should be considered in the context of a patient's recent exposures and the presence of clinical signs and symptoms consistent with COVID-19. An individual without symptoms of COVID-19 and who is not shedding SARS-CoV-2 virus would expect to have a negative (not detected) result in this assay. Performed  At: [facility] [address] [place], [place] [id number] Rush Farmer MD NW:[id number]    [place]  Final    Comment: Performed at [facility], [place] [address] [place], [place] [address]         Radiology Studies: Ct Head Wo Contrast  Result Date: 10/31/2018 CLINICAL DATA:  Left-sided facial droop and left-sided weakness. EXAM: CT HEAD WITHOUT CONTRAST CT CERVICAL SPINE WITHOUT CONTRAST TECHNIQUE: Multidetector CT imaging of the head and cervical spine was performed following the standard protocol without intravenous contrast.  Multiplanar CT image reconstructions of the cervical spine were also generated. COMPARISON:  CT scan 04/17/2018 FINDINGS: CT HEAD FINDINGS Brain: There is a moderate to large right-sided subdural hematoma in the right frontal and parietal regions. However, because of the underlying atrophy I do not really see any significant mass effect on the right lateral ventricle. The sulci and not compressed and is no subfalcine herniation. There is chronic underlying atrophy, ventriculomegaly and periventricular white matter disease. No findings for hemispheric infarction or parenchymal hemorrhage. The brainstem and cerebellum are grossly normal in stable. Vascular: Advanced vascular calcifications but no obvious aneurysm or hyperdense vessels. Skull: No skull fracture or bone lesion. Sinuses/Orbits: The paranasal sinuses and mastoid air cells are grossly clear. The globes are intact. Other: No scalp hematoma or laceration. CT CERVICAL SPINE FINDINGS Alignment: Normal overall alignment. There is advanced degenerative cervical spondylosis with multilevel disc disease and facet disease and mild degenerative subluxation of C3. The facets are normally aligned. Skull base and vertebrae: No acute fracture. No primary bone lesion or focal pathologic process. Soft tissues and spinal canal: No prevertebral fluid or swelling. No visible canal hematoma. Disc  levels: The spinal canal is generous. No significant spinal stenosis. Mild multilevel foraminal stenosis due to facet disease and uncinate spurring. Upper chest: A right-sided pleural effusion is noted. Calcified granuloma at the right lung apex. Other: No neck mass or adenopathy. Carotid artery calcifications are noted. IMPRESSION: 1. Moderate-sized right-sided subdural hematoma but without significant mass effect. 2. No CT findings for acute hemispheric infarction or parenchymal hemorrhage. 3. No skull fracture. 4. Degenerative cervical spondylosis but no acute cervical spine fracture. Electronically Signed   By: Marijo Sanes M.D.   On: 10/31/2018 12:27   Ct Cervical Spine Wo Contrast  Result Date: 10/31/2018 CLINICAL DATA:  Left-sided facial droop and left-sided weakness. EXAM: CT HEAD WITHOUT CONTRAST CT CERVICAL SPINE WITHOUT CONTRAST TECHNIQUE: Multidetector CT imaging of the head and cervical spine was performed following the standard protocol without intravenous contrast. Multiplanar CT image reconstructions of the cervical spine were also generated. COMPARISON:  CT scan 04/17/2018 FINDINGS: CT HEAD FINDINGS Brain: There is a moderate to large right-sided subdural hematoma in the right frontal and parietal regions. However, because of the underlying atrophy I do not really see any significant mass effect on the right lateral ventricle. The sulci and not compressed and is no subfalcine herniation. There is chronic underlying atrophy, ventriculomegaly and periventricular white matter disease. No findings for hemispheric infarction or parenchymal hemorrhage. The brainstem and cerebellum are grossly normal in stable. Vascular: Advanced vascular calcifications but no obvious aneurysm or hyperdense vessels. Skull: No skull fracture or bone lesion. Sinuses/Orbits: The paranasal sinuses and mastoid air cells are grossly clear. The globes are intact. Other: No scalp hematoma or laceration. CT CERVICAL SPINE  FINDINGS Alignment: Normal overall alignment. There is advanced degenerative cervical spondylosis with multilevel disc disease and facet disease and mild degenerative subluxation of C3. The facets are normally aligned. Skull base and vertebrae: No acute fracture. No primary bone lesion or focal pathologic process. Soft tissues and spinal canal: No prevertebral fluid or swelling. No visible canal hematoma. Disc levels: The spinal canal is generous. No significant spinal stenosis. Mild multilevel foraminal stenosis due to facet disease and uncinate spurring. Upper chest: A right-sided pleural effusion is noted. Calcified granuloma at the right lung apex. Other: No neck mass or adenopathy. Carotid artery calcifications are noted. IMPRESSION: 1. Moderate-sized right-sided subdural hematoma but without significant mass effect. 2. No  CT findings for acute hemispheric infarction or parenchymal hemorrhage. 3. No skull fracture. 4. Degenerative cervical spondylosis but no acute cervical spine fracture. Electronically Signed   By: Marijo Sanes M.D.   On: 10/31/2018 12:27   Mr Jeri Cos VZ Contrast  Result Date: 10/31/2018 CLINICAL DATA:  Subdural hematoma. Multiple recent falls. Left-sided facial droop for 2 days. EXAM: MRI HEAD WITHOUT AND WITH CONTRAST TECHNIQUE: Multiplanar, multiecho pulse sequences of the brain and surrounding structures were obtained without and with intravenous contrast. CONTRAST:  7 mL Gadavist COMPARISON:  None. FINDINGS: BRAIN: Punctate focus of abnormal diffusion-weighted intensity within the left thalamus. The midline structures are normal. Right holo hemispheric subdural hematoma measures approximately 16 mm in thickness. There is no midline shift or other mass effect. There is hyperenhancement of the right convexity dura. Early confluent hyperintense T2-weighted signal of the periventricular and deep white matter, most commonly due to chronic ischemic microangiopathy. Generalized atrophy  without lobar predilection. Susceptibility-sensitive sequences show no chronic microhemorrhage or superficial siderosis. No mass lesion. VASCULAR: The major intracranial arterial and venous sinus flow voids are normal. SKULL AND UPPER CERVICAL SPINE: Calvarial bone marrow signal is normal. There is no skull base mass. Visualized upper cervical spine and soft tissues are normal. SINUSES/ORBITS: No fluid levels or advanced mucosal thickening. No mastoid or middle ear effusion. The orbits are normal. IMPRESSION: 1. Right hemispheric subdural hematoma measuring up to 16 mm in thickness without midline shift or other mass effect. 2. Punctate focus of subacute ischemia within the left thalamus. No other recent ischemia. 3. Generalized volume loss and chronic ischemic microangiopathy. Electronically Signed   By: Ulyses Jarred M.D.   On: 10/31/2018 22:25        Scheduled Meds:   stroke: mapping our early stages of recovery book   Does not apply Once   olopatadine  1 drop Both Eyes QHS   timolol  1 drop Both Eyes BID   Continuous Infusions:   LOS: 0 days    Time spent: 25 minutes spent in the coordination of care today.    Jonnie Finner, DO [facility] Pager [phone]  If 7PM-7AM, please contact night-coverage [email or web address] Password [facility] 11/01/2018, 1:49 PM

## 2018-11-02 ENCOUNTER — Inpatient Hospital Stay (HOSPITAL_COMMUNITY): Payer: Medicare HMO

## 2018-11-02 DIAGNOSIS — E785 Hyperlipidemia, unspecified: Secondary | ICD-10-CM

## 2018-11-02 DIAGNOSIS — N400 Enlarged prostate without lower urinary tract symptoms: Secondary | ICD-10-CM

## 2018-11-02 DIAGNOSIS — H409 Unspecified glaucoma: Secondary | ICD-10-CM

## 2018-11-02 DIAGNOSIS — R413 Other amnesia: Secondary | ICD-10-CM

## 2018-11-02 DIAGNOSIS — I639 Cerebral infarction, unspecified: Secondary | ICD-10-CM

## 2018-11-02 LAB — RENAL FUNCTION PANEL
Albumin: 3.1 g/dL — ABNORMAL LOW (ref 3.5–5.0)
Anion gap: 11 (ref 5–15)
BUN: 14 mg/dL (ref 8–23)
CO2: 19 mmol/L — ABNORMAL LOW (ref 22–32)
Calcium: 8.4 mg/dL — ABNORMAL LOW (ref 8.9–10.3)
Chloride: 106 mmol/L (ref 98–111)
Creatinine, Ser: 0.74 mg/dL (ref 0.61–1.24)
GFR calc Af Amer: >60 mL/min (ref >60)
GFR calc non Af Amer: >60 mL/min (ref >60)
Glucose, Bld: 103 mg/dL — ABNORMAL HIGH (ref 70–99)
Phosphorus: 3.2 mg/dL (ref 2.5–4.6)
Potassium: 4 mmol/L (ref 3.5–5.1)
Sodium: 136 mmol/L (ref 135–145)

## 2018-11-02 LAB — CBC WITH DIFFERENTIAL/PLATELET
Abs Immature Granulocytes: 0 10*3/uL (ref 0.00–0.07)
Basophils Absolute: 0 10*3/uL (ref 0.0–0.1)
Basophils Relative: 0 %
Eosinophils Absolute: 0 10*3/uL (ref 0.0–0.5)
Eosinophils Relative: 0 %
HCT: 46.1 % (ref 39.0–52.0)
Hemoglobin: 14.9 g/dL (ref 13.0–17.0)
Lymphocytes Relative: 10 %
Lymphs Abs: 1.3 10*3/uL (ref 0.7–4.0)
MCH: 30.5 pg (ref 26.0–34.0)
MCHC: 32.3 g/dL (ref 30.0–36.0)
MCV: 94.5 fL (ref 80.0–100.0)
Monocytes Absolute: 1.7 10*3/uL — ABNORMAL HIGH (ref 0.1–1.0)
Monocytes Relative: 13 %
Neutro Abs: 10.2 10*3/uL — ABNORMAL HIGH (ref 1.7–7.7)
Neutrophils Relative %: 77 %
Platelets: 72 10*3/uL — ABNORMAL LOW (ref 150–400)
RBC: 4.88 MIL/uL (ref 4.22–5.81)
RDW: 14.2 % (ref 11.5–15.5)
WBC: 13.3 10*3/uL — ABNORMAL HIGH (ref 4.0–10.5)
nRBC: 0 % (ref 0.0–0.2)

## 2018-11-02 LAB — LIPID PANEL
Cholesterol: 163 mg/dL (ref 0–200)
HDL: 48 mg/dL (ref >40)
LDL Cholesterol: 103 mg/dL — ABNORMAL HIGH (ref 0–99)
Total CHOL/HDL Ratio: 3.4 RATIO
Triglycerides: 61 mg/dL (ref <150)
VLDL: 12 mg/dL (ref 0–40)

## 2018-11-02 LAB — MAGNESIUM: Magnesium: 2 mg/dL (ref 1.7–2.4)

## 2018-11-02 LAB — HEMOGLOBIN A1C
Hgb A1c MFr Bld: 5.7 % — ABNORMAL HIGH (ref 4.8–5.6)
Mean Plasma Glucose: 116.89 mg/dL

## 2018-11-02 MED ORDER — QUETIAPINE FUMARATE 50 MG PO TABS
50.0000 mg | ORAL_TABLET | Freq: Every day | ORAL | Status: DC
  Administered 2018-11-02: 50 mg via ORAL
  Filled 2018-11-02: qty 1

## 2018-11-02 MED ORDER — STARCH (THICKENING) PO POWD
ORAL | Status: DC | PRN
  Filled 2018-11-02 (×2): qty 227

## 2018-11-02 MED ORDER — SIMVASTATIN 20 MG PO TABS
20.0000 mg | ORAL_TABLET | Freq: Every day | ORAL | Status: DC
  Administered 2018-11-02 – 2018-11-03 (×2): 20 mg via ORAL
  Filled 2018-11-02 (×2): qty 1

## 2018-11-02 MED ORDER — DOXAZOSIN MESYLATE 2 MG PO TABS
2.0000 mg | ORAL_TABLET | Freq: Every day | ORAL | Status: DC
  Administered 2018-11-03: 2 mg via ORAL
  Filled 2018-11-02 (×3): qty 1

## 2018-11-02 NOTE — Progress Notes (Signed)
  Speech Language Pathology Treatment: Dysphagia  Patient Details Name: APOLO CUTSHAW MRN: [id number] DOB: 26-Jun-1920 Today's Date: 11/02/2018 Time: 0092-3300 SLP Time Calculation (min) (ACUTE ONLY): 25 min  Assessment / Plan / Recommendation Clinical Impression  Patient seen to address dysphagia goals and determine readiness for PO's. Daughter is present and educated, as well as discussed patient's premorbid status. She stated that prior to this hospitalization, he was able to feed himself and had not exhibited any difficulty with PO intake. Patient was sitting in recliner and eyes closed when SLP entered the room but he was able to be aroused and then maintain attention for PO intake with minimal cues. He exhibited immediate and slightly delayed throat clearing and coughing (congested cough without expectoration) with thin liquids, but did not exhibit any overt s/s of aspiration or penetration with nectar thick liquids via cup sips. Swallow initiation was delayed for all tested consistencies. He tolerated spoon bites of puree solids without overt s/s of aspiration or penetration and without oral residuals post swallows. Patient does exhibit anterior spillage of liquids without awareness and without awareness to when he is spilling liquids from cup secondary to not able to direct cup sips to lips without guidance. Plan to initiate puree solids (Dys 1) and nectar thick liquids and expect patient to improve as alertness improves.     HPI HPI: Amritpal Shropshire  is a 85 y.o. male, w hyperlipidemia, BPH, Glaucoma, ? Dementia, apparently had 2 falls in the past 2 days.  Pt apparently had c/o left sided facial droop for the past 2 days and generalized weakness. Typically walks with walker , but now unable to walk per his daughter.  Didn't seem to use his left hand quite as well.  Pt lives with wife and has caretakers.  MRI revealed Right hemispheric subdural hematoma measuring up to 16 mm in thickness without  midline shift or other mass effect, punctate focus of subacute ischemia within the left thalamus, no other recent ischemia and generalized volume loss and chronic ischemic microangiopathy.       SLP Plan  Continue with current plan of care       Recommendations  Diet recommendations: Dysphagia 1 (puree);Nectar-thick liquid Liquids provided via: Cup Medication Administration: Crushed with puree Supervision: Staff to assist with self feeding;Full supervision/cueing for compensatory strategies Compensations: Minimize environmental distractions;Slow rate;Small sips/bites;Monitor for anterior loss;Other (Comment)(check for pocketing/oral residuals after PO's) Postural Changes and/or Swallow Maneuvers: Seated upright 90 degrees                Oral Care Recommendations: Oral care BID Follow up Recommendations: Other (comment)(TBD) SLP Visit Diagnosis: Dysphagia, unspecified (R13.10) Plan: Continue with current plan of care       GO                Dannial Monarch 11/02/2018, 1:39 PM    Sonia Baller, MA, CCC-SLP Speech Therapy [facility] Acute Rehab Pager: [phone]

## 2018-11-02 NOTE — Evaluation (Signed)
Physical Therapy Evaluation Patient Details Name: [name] MRN: [id number] DOB: [birth year] Today's Date: 11/02/2018   History of Present Illness  this 85 y.o. male admitted wtih Lt sided facial droop and generalized weakness.  He also has had at least two falls.  MRI revealed Rt hemispheric SDH without midline shift or mass effect, punctate focus of subacute inschemia Lt thalamus.  PMH includes:  thrombocytopenia, memory impairment, HTN, hallucinations     Clinical Impression  Patient admitted with the above listed diagnosis. Patients daughter present and providing PLOF as patient is a poor historian. Daughter reports he was ambulatory prior to admission where he lived at home with his wife - had caregivers available to him nearly 24/7. Patient today received in recliner. Heavy verbal and tactile cueing for sit to stand at recliner with Max A +2. Due to current functional status and need for physical assist with recommend SNF at discharge. PT to continue to follow acutely.      Follow Up Recommendations SNF;Supervision/Assistance - 24 hour    Equipment Recommendations  Other (comment)(defer)    Recommendations for Other Services       Precautions / Restrictions Precautions Precautions: Fall Restrictions Weight Bearing Restrictions: No      Mobility  Bed Mobility               General bed mobility comments: Pt sitting up in chair   Transfers Overall transfer level: Needs assistance Equipment used: 2 person hand held assist Transfers: Sit to/from Stand Sit to Stand: Max assist;+2 physical assistance;+2 safety/equipment         General transfer comment: Pt very slow to initiate movement.  He requires assist to move into standing, and assist for balance.  He achieved standing x 2 briefly before abruptly sitting.  Attempted 3 more times, but unable to achieve full standing   Ambulation/Gait             General Gait Details: unable  Stairs             Wheelchair Mobility    Modified Rankin (Stroke Patients Only)       Balance Overall balance assessment: Needs assistance Sitting-balance support: Feet supported Sitting balance-Leahy Scale: Poor   Postural control: Posterior lean;Right lateral lean Standing balance support: Bilateral upper extremity supported Standing balance-Leahy Scale: Poor Standing balance comment: Pt requires max A +2 for brief static standing                              Pertinent Vitals/Pain Pain Assessment: Faces Faces Pain Scale: No hurt    Home Living Family/patient expects to be discharged to:: Private residence Living Arrangements: Spouse/significant other Available Help at Discharge: Personal care attendant;Family;Available 24 hours/day(reports 4 hr gap between family and caretaker assist) Type of Home: House Home Access: Ramped entrance     Home Layout: One level Home Equipment: [place] - 4 wheels;Wheelchair - manual;Shower seat;Hand held shower head Additional Comments: has a Designer, fashion/clothing that is handicapped accessible and available for O2 setup    Prior Function Level of Independence: Needs assistance   Gait / Transfers Assistance Needed: Per daughter, pt ambulates mod I with rollator   ADL's / Homemaking Assistance Needed: has been performing ADLs - wears adult diapers , easy to apply clothes        Hand Dominance   Dominant Hand: Right    Extremity/Trunk Assessment   Upper Extremity Assessment Upper Extremity Assessment: RUE  deficits/detail RUE Deficits / Details: Pt spontaneously reached for armrest of chair, and used Rt UE to assist himself into standing.  He rubs his fae and chin with Rt hand  LUE Deficits / Details: Pt does not spontaneously use or move Lt UE     Lower Extremity Assessment Lower Extremity Assessment: Difficult to assess due to impaired cognition    Cervical / Trunk Assessment Cervical / Trunk Assessment: Kyphotic  Communication    Communication: Expressive difficulties(limited output )  Cognition Arousal/Alertness: Lethargic Behavior During Therapy: Flat affect Overall Cognitive Status: Impaired/Different from baseline Area of Impairment: Orientation;Attention;Memory;Following commands;Safety/judgement;Awareness;Problem solving                 Orientation Level: Disoriented to;Place;Time;Situation Current Attention Level: Focused   Following Commands: Follows one step commands inconsistently;Follows one step commands with increased time     Problem Solving: Slow processing;Decreased initiation;Difficulty sequencing;Requires verbal cues;Requires tactile cues General Comments: Pt keeps eyes closed majority of time.  He will intemittently respond to questions with 1-2 words.   He spontaneously rubs his face, and when fully alert, will look to therapist.   Otherwise, no interaction with his environment       General Comments General comments (skin integrity, edema, etc.): patients daughter present and providing PLOF    Exercises     Assessment/Plan    PT Assessment Patient needs continued PT services  PT Problem List Decreased strength;Decreased activity tolerance;Decreased balance;Decreased mobility;Decreased knowledge of use of DME;Decreased safety awareness       PT Treatment Interventions DME instruction;Gait training;Stair training;Functional mobility training;Therapeutic activities;Therapeutic exercise;Balance training;Neuromuscular re-education;Patient/family education    PT Goals (Current goals can be found in the Care Plan section)  Acute Rehab PT Goals Patient Stated Goal: daughter is hopeful he will get stronger  PT Goal Formulation: With patient Time For Goal Achievement: 11/16/18 Potential to Achieve Goals: Fair    Frequency Min 3X/week   Barriers to discharge        Co-evaluation PT/OT/SLP Co-Evaluation/Treatment: Yes Reason for Co-Treatment: Complexity of the patient's  impairments (multi-system involvement);Necessary to address cognition/behavior during functional activity;For patient/therapist safety;To address functional/ADL transfers PT goals addressed during session: Mobility/safety with mobility OT goals addressed during session: ADL's and self-care;Strengthening/ROM       AM-PAC PT "6 Clicks" Mobility  Outcome Measure Help needed turning from your back to your side while in a flat bed without using bedrails?: A Lot Help needed moving from lying on your back to sitting on the side of a flat bed without using bedrails?: A Lot Help needed moving to and from a bed to a chair (including a wheelchair)?: Total Help needed standing up from a chair using your arms (e.g., wheelchair or bedside chair)?: Total Help needed to walk in hospital room?: Total Help needed climbing 3-5 steps with a railing? : Total 6 Click Score: 8    End of Session Equipment Utilized During Treatment: Gait belt Activity Tolerance: Patient limited by lethargy Patient left: in chair;with call bell/phone within reach;with chair alarm set;with nursing/sitter in room Nurse Communication: Mobility status PT Visit Diagnosis: Unsteadiness on feet (R26.81);Other abnormalities of gait and mobility (R26.89);Muscle weakness (generalized) (M62.81);History of falling (Z91.81)    Time: 1340-1416 PT Time Calculation (min) (ACUTE ONLY): 36 min   Charges:   PT Evaluation $PT Eval Moderate Complexity: 1 Mod           Lanney Gins, PT, DPT Supplemental Physical Therapist 11/02/18 3:42 PM Pager: [phone] Office: [phone]

## 2018-11-02 NOTE — Evaluation (Signed)
Occupational Therapy Evaluation Patient Details Name: [name] MRN: [id number] DOB: Oct 08, 1920 Today's Date: 11/02/2018    History of Present Illness this 85 y.o. male admitted wtih Lt sided facial droop and generalized weakness.  He also has had at least two falls.  MRI revealed Rt hemispheric SDH without midline shift or mass effect, punctate focus of subacute inschemia Lt thalamus.  PMH includes:  thrombocytopenia, memory impairment, HTN, hallucinations    Clinical Impression   Pt admitted with above. He demonstrates the below listed deficits and will benefit from continued OT to maximize safety and independence with BADLs.  Pt seen in conjunction with PT.  Pt sitting in recliner with daughter present.  Pt was lethargic and minimally participatory.  He did stand x 2 with max A +2, followed an occasional one step commands, and answered questions occasionally.   He requires total A for ADLs.  PTA, he lived at home with his wife.  They had 20 hours/day of hired caregiver assist, as well as daughter assisting.  Daughter is aware that pt is currently requiring +2 assist, and is in process of trying to hire more assistants.  She however, questions if he may need a SNF stay for rehab before returning home - her preference is home, if they are able to meet his needs.  If he discharges home, he will need a hoyer lift and hospital bed.  Will follow acutely       Follow Up Recommendations  SNF;Supervision/Assistance - 24 hour    Equipment Recommendations  Hospital bed;Other (comment)(hoyer lift )    Recommendations for Other Services       Precautions / Restrictions Precautions Precautions: Fall Restrictions Weight Bearing Restrictions: No      Mobility Bed Mobility               General bed mobility comments: Pt sitting up in chair   Transfers Overall transfer level: Needs assistance Equipment used: 2 person hand held assist Transfers: Sit to/from Stand Sit to Stand: Max  assist;+2 physical assistance;+2 safety/equipment         General transfer comment: Pt very slow to initiate movement.  He requires assist to move into standing, and assist for balance.  He achieved standing x 2 briefly before abruptly sitting.  Attempted 3 more times, but unable to achieve full standing     Balance Overall balance assessment: Needs assistance Sitting-balance support: Feet supported Sitting balance-Leahy Scale: Poor   Postural control: Posterior lean;Right lateral lean Standing balance support: Bilateral upper extremity supported Standing balance-Leahy Scale: Poor Standing balance comment: Pt requires max A +2 for brief static standing                            ADL either performed or assessed with clinical judgement   ADL Overall ADL's : Needs assistance/impaired Eating/Feeding: Maximal assistance;Total assistance;Sitting   Grooming: Wash/dry hands;Wash/dry face;Brushing hair;Total assistance;Sitting   Upper Body Bathing: Total assistance;Sitting   Lower Body Bathing: Total assistance;Sit to/from stand   Upper Body Dressing : Total assistance;Sitting   Lower Body Dressing: Total assistance;Sit to/from stand   Toilet Transfer: Maximal assistance;+2 for physical assistance;+2 for safety/equipment;Stand-pivot;BSC   Toileting- Clothing Manipulation and Hygiene: Total assistance;Sit to/from stand       Functional mobility during ADLs: Maximal assistance;+2 for physical assistance;+2 for safety/equipment General ADL Comments: Pt did not engage in ADL tasks despite hand over hand cues      Vision Baseline  Vision/History: Macular Degeneration Additional Comments: Daughter reports pt has significant visual deficits due to macular degneration.  He keeps his eyes closed the majority of the time.  He seems to have a Rt gaze preference, but will look to the Lt with mod prompting      Perception Perception Perception Tested?: Yes Perception Deficits:  Inattention/neglect Inattention/Neglect: Does not attend to left visual field;Does not attend to left side of body   Praxis Praxis Praxis tested?: Deficits Deficits: Initiation;Ideation    Pertinent Vitals/Pain Pain Assessment: Faces Faces Pain Scale: No hurt     Hand Dominance Right   Extremity/Trunk Assessment Upper Extremity Assessment Upper Extremity Assessment: LUE deficits/detail;RUE deficits/detail RUE Deficits / Details: Pt spontaneously reached for armrest of chair, and used Rt UE to assist himself into standing.  He rubs his fae and chin with Rt hand  LUE Deficits / Details: Pt does not spontaneously use or move Lt UE    Lower Extremity Assessment Lower Extremity Assessment: Defer to PT evaluation   Cervical / Trunk Assessment Cervical / Trunk Assessment: Kyphotic   Communication Communication Communication: Expressive difficulties(limited output )   Cognition Arousal/Alertness: Lethargic Behavior During Therapy: Flat affect Overall Cognitive Status: Impaired/Different from baseline Area of Impairment: Orientation;Attention;Memory;Following commands;Safety/judgement;Awareness;Problem solving                 Orientation Level: Disoriented to;Place;Time;Situation Current Attention Level: Focused   Following Commands: Follows one step commands inconsistently;Follows one step commands with increased time     Problem Solving: Slow processing;Decreased initiation;Difficulty sequencing;Requires verbal cues;Requires tactile cues General Comments: Pt keeps eyes closed majority of time.  He will intemittently respond to questions with 1-2 words.   He spontaneously rubs his face, and when fully alert, will look to therapist.   Otherwise, no interaction with his environment    General Comments  Pt's daughter present during session.  Discussed current level of function and needs at discharge.  She reports they are in process of hiring more caregivers so he has 2   caregivers at home with him, but she feels he may need SNF level rehab as she feels he may need more assist than they can provide.  Her prefernce, though, is home.  Discussed use of lift equipment as an option     Exercises     Shoulder Instructions      Home Living Family/patient expects to be discharged to:: Private residence Living Arrangements: Spouse/significant other Available Help at Discharge: Personal care attendant;Family;Available 24 hours/day(reports 4 hr gap between family and caretaker assist) Type of Home: House Home Access: Ramped entrance     Home Layout: One level     Bathroom Shower/Tub: (walk in bathtub)   Biochemist, clinical: (various height toilets in home)     Home Equipment: Environmental consultant - 4 wheels;Wheelchair - manual;Shower seat;Hand held shower head   Additional Comments: has a Designer, fashion/clothing that is handicapped accessible and available for O2 setup      Prior Functioning/Environment Level of Independence: Needs assistance  Gait / Transfers Assistance Needed: Per daughter, pt ambulates mod I with rollator  ADL's / Homemaking Assistance Needed: has been performing ADLs - wears adult diapers , easy to apply clothes            OT Problem List: Decreased strength;Decreased activity tolerance;Impaired balance (sitting and/or standing);Impaired vision/perception;Decreased coordination;Decreased cognition;Decreased safety awareness;Decreased knowledge of use of DME or AE;Impaired UE functional use      OT Treatment/Interventions: Self-care/ADL training;Neuromuscular education;DME and/or AE instruction;Therapeutic activities;Cognitive remediation/compensation;Visual/perceptual  remediation/compensation;Patient/family education;Balance training    OT Goals(Current goals can be found in the care plan section) Acute Rehab OT Goals Patient Stated Goal: daughter is hopeful he will get stronger  OT Goal Formulation: With family Time For Goal Achievement:  11/16/18 Potential to Achieve Goals: Good ADL Goals Pt Will Perform Eating: with min assist;with adaptive utensils;sitting Pt Will Perform Grooming: with min assist;sitting Pt Will Perform Upper Body Bathing: with mod assist;sitting Pt Will Perform Lower Body Bathing: with max assist;sit to/from stand Pt Will Transfer to Toilet: with mod assist;stand pivot transfer;bedside commode  OT Frequency: Min 2X/week   Barriers to D/C:    unsure if he has adequate assist at home        Co-evaluation PT/OT/SLP Co-Evaluation/Treatment: Yes Reason for Co-Treatment: Complexity of the patient's impairments (multi-system involvement);Necessary to address cognition/behavior during functional activity;For patient/therapist safety;To address functional/ADL transfers   OT goals addressed during session: ADL's and self-care;Strengthening/ROM      AM-PAC OT "6 Clicks" Daily Activity     Outcome Measure Help from another person eating meals?: Total Help from another person taking care of personal grooming?: Total Help from another person toileting, which includes using toliet, bedpan, or urinal?: Total Help from another person bathing (including washing, rinsing, drying)?: Total Help from another person to put on and taking off regular upper body clothing?: Total Help from another person to put on and taking off regular lower body clothing?: Total 6 Click Score: 6   End of Session Equipment Utilized During Treatment: Gait belt Nurse Communication: Mobility status  Activity Tolerance: Patient limited by lethargy Patient left: in chair;with call bell/phone within reach;with chair alarm set;with family/visitor present  OT Visit Diagnosis: Unsteadiness on feet (R26.81);Cognitive communication deficit (R41.841)                Time: 1340-1416 OT Time Calculation (min): 36 min Charges:  OT General Charges $OT Visit: 1 Visit OT Evaluation $OT Eval Moderate Complexity: 1 Mod  Lucille Passy,  OTR/L Acute Rehabilitation Services Pager [phone] Office [phone]   Lucille Passy M 11/02/2018, 3:26 PM

## 2018-11-02 NOTE — Progress Notes (Addendum)
PROGRESS NOTE    GALAN GHEE  XBM:[id number] DOB: Oct 13, 1920 DOA: 10/31/2018 PCP: Patient, No Pcp Per   Brief Narrative:   Raliegh [age] y.o.male,w hyperlipidemia,BPH, Glaucoma,? Dementia, apparently had2 falls in the past 2 days. Pt apparently had c/o left sided facial droopfor the past 2 days and generalized weakness. Typically walks with walker , but now unable to walk per his daughter. Didn't seem to use his left hand quite as well. Pt lives with wife and has caretakers. Daughter states took aleve for his back last nite. Otherwise no aspirin or blood thinner.    Assessment & Plan:   Active Problems:   Subdural hematoma (HCC)   Thrombocytopenia (HCC)   BPH (benign prostatic hyperplasia)   HLD (hyperlipidemia)   Memory disorder   Glaucoma   CVA (cerebral vascular accident) ([place])   Subdural hematoma, moderate, R side     - Daughter notes he took Aleve last nite, otherwise no aspirin, no blood thinner     - MRI brain: 1. Right hemispheric subdural hematoma measuring up to 16 mm in thickness without midline shift or other mass effect. 2. Punctate focus of subacute ischemia within the left thalamus. No other recent ischemia.     - NPO      - neurosurgery onboard, repeating CT head  Subacute stroke     - MRI brain: Punctate focus left thalamic subacute ischemia     - family reports that he had an inital fall about 2 weeks ago     - Spoke with neuro; unable to start ASA at this time d/t bleed; no need for carotids; initally therapy evals     - can start ASA once cleared by neurosurg  Thrombocytopenia (chronic)     - follows with pcp     - SCDs for DVT PPx  Cardiac murmr     - Echo results pending  BPH     - Cont Cardura 2mg  po qday one cleared from NPO status  Hyperlipidemia     - Cont Simvastatin 20mg  po qday once cleared from NPO status  Glaucoma     - Cont Timolol  Memory disorder ? Dementia     - Hold Seroquel for now   DVT  prophylaxis: SCD Code Status: DNR Family Communication: Spoke with dtr at bedside   Disposition Plan: TBD   Consultants:   Neurosurgery   Subjective: More active ON per family  Objective: Vitals:   11/01/18 1527 11/01/18 2000 11/01/18 2343 11/02/18 0344  BP: (!) 144/81 (!) 152/87 (!) 127/91 120/77  Pulse: 71 83 71 71  Resp: 18 18 20 18   Temp: 97.9 F (36.6 C)  98.5 F (36.9 C) 98.5 F (36.9 C)  TempSrc: Oral  Oral Oral  SpO2: 99% 100% 96% 99%  Weight:    74 kg    Intake/Output Summary (Last 24 hours) at 11/02/2018 0272 Last data filed at 11/01/2018 2357 Gross per 24 hour  Intake 0 ml  Output 975 ml  Net -975 ml   Filed Weights   11/01/18 0500 11/02/18 0344  Weight: 74 kg 74 kg    Examination:  General: 85 y.o. male resting in bed in NAD Cardiovascular: RRR, +S1, S2, no m/g/r, equal pulses throughout Respiratory: CTABL, no w/r/r, normal WOB GI: BS+, NDNT, no masses noted, no organomegaly noted MSK: No e/c/c Skin: No rashes, ulcerations noted; right forehead bruising noted Neuro: somnolent, but somewhat agitated    Data Reviewed: I have personally reviewed following labs  and imaging studies.  CBC: Recent Labs  Lab 10/31/18 1057 10/31/18 1119 11/01/18 0913  WBC 5.8  --  8.0  NEUTROABS 3.0  --   --   HGB 13.6 14.6 13.3  HCT 42.2 43.0 41.4  MCV 96.8  --  95.4  PLT 74*  --  67*   Basic Metabolic Panel: Recent Labs  Lab 10/31/18 1057 10/31/18 1119 11/01/18 0913  NA 139 137 138  K 3.9 4.0 4.0  CL 105 101 105  CO2 25  --  25  GLUCOSE 125* 117* 89  BUN 10 11 9   CREATININE 0.73 0.70 0.69  CALCIUM 8.8*  --  8.3*   GFR: CrCl cannot be calculated (Unknown ideal weight.). Liver Function Tests: Recent Labs  Lab 10/31/18 1057 11/01/18 0913  AST 20 21  ALT 10 10  ALKPHOS 57 53  BILITOT 0.9 1.1  PROT 6.5 6.2*  ALBUMIN 3.6 3.1*   No results for input(s): LIPASE, AMYLASE in the last 168 hours. No results for input(s): AMMONIA in the last 168  hours. Coagulation Profile: Recent Labs  Lab 10/31/18 1057  INR 1.2   Cardiac Enzymes: No results for input(s): CKTOTAL, CKMB, CKMBINDEX, TROPONINI in the last 168 hours. BNP (last 3 results) No results for input(s): PROBNP in the last 8760 hours. HbA1C: No results for input(s): HGBA1C in the last 72 hours. CBG: No results for input(s): GLUCAP in the last 168 hours. Lipid Profile: No results for input(s): CHOL, HDL, LDLCALC, TRIG, CHOLHDL, LDLDIRECT in the last 72 hours. Thyroid Function Tests: No results for input(s): TSH, T4TOTAL, FREET4, T3FREE, THYROIDAB in the last 72 hours. Anemia Panel: No results for input(s): VITAMINB12, FOLATE, FERRITIN, TIBC, IRON, RETICCTPCT in the last 72 hours. Sepsis Labs: No results for input(s): PROCALCITON, LATICACIDVEN in the last 168 hours.  Recent Results (from the past 240 hour(s))  Novel Coronavirus,NAA,(SEND-OUT TO REF LAB - TAT 24-48 hrs); Hosp Order     Status: None   Collection Time: 10/31/18  1:56 PM   Specimen: Nasopharyngeal Swab; Respiratory  Result Value Ref Range Status   SARS-CoV-2, NAA NOT DETECTED NOT DETECTED Final    Comment: (NOTE) This test was developed and its performance characteristics determined by [employer]. This test has not been FDA cleared or approved. This test has been authorized by FDA under an Emergency Use Authorization (EUA). This test is only authorized for the duration of time the declaration that circumstances exist justifying the authorization of the emergency use of in vitro diagnostic tests for detection of SARS-CoV-2 virus and/or diagnosis of COVID-19 infection under section 564(b)(1) of the Act, 21 U.S.C. 656CLE-7(N)(1), unless the authorization is terminated or revoked sooner. When diagnostic testing is negative, the possibility of a false negative result should be considered in the context of a patient's recent exposures and the presence of clinical signs and symptoms consistent  with COVID-19. An individual without symptoms of COVID-19 and who is not shedding SARS-CoV-2 virus would expect to have a negative (not detected) result in this assay. Performed  At: [facility] [address] [place], [place] [id number] Rush Farmer MD HQ:[id number]    [place]  Final    Comment: Performed at [facility], [place] [address] [place], [place] [address]         Radiology Studies: Ct Head Wo Contrast  Result Date: 10/31/2018 CLINICAL DATA:  Left-sided facial droop and left-sided weakness. EXAM: CT HEAD WITHOUT CONTRAST CT CERVICAL SPINE WITHOUT CONTRAST TECHNIQUE: Multidetector CT  imaging of the head and cervical spine was performed following the standard protocol without intravenous contrast. Multiplanar CT image reconstructions of the cervical spine were also generated. COMPARISON:  CT scan 04/17/2018 FINDINGS: CT HEAD FINDINGS Brain: There is a moderate to large right-sided subdural hematoma in the right frontal and parietal regions. However, because of the underlying atrophy I do not really see any significant mass effect on the right lateral ventricle. The sulci and not compressed and is no subfalcine herniation. There is chronic underlying atrophy, ventriculomegaly and periventricular white matter disease. No findings for hemispheric infarction or parenchymal hemorrhage. The brainstem and cerebellum are grossly normal in stable. Vascular: Advanced vascular calcifications but no obvious aneurysm or hyperdense vessels. Skull: No skull fracture or bone lesion. Sinuses/Orbits: The paranasal sinuses and mastoid air cells are grossly clear. The globes are intact. Other: No scalp hematoma or laceration. CT CERVICAL SPINE FINDINGS Alignment: Normal overall alignment. There is advanced degenerative cervical spondylosis with multilevel disc disease and facet disease and mild degenerative subluxation of C3. The facets are normally aligned. Skull  base and vertebrae: No acute fracture. No primary bone lesion or focal pathologic process. Soft tissues and spinal canal: No prevertebral fluid or swelling. No visible canal hematoma. Disc levels: The spinal canal is generous. No significant spinal stenosis. Mild multilevel foraminal stenosis due to facet disease and uncinate spurring. Upper chest: A right-sided pleural effusion is noted. Calcified granuloma at the right lung apex. Other: No neck mass or adenopathy. Carotid artery calcifications are noted. IMPRESSION: 1. Moderate-sized right-sided subdural hematoma but without significant mass effect. 2. No CT findings for acute hemispheric infarction or parenchymal hemorrhage. 3. No skull fracture. 4. Degenerative cervical spondylosis but no acute cervical spine fracture. Electronically Signed   By: Marijo Sanes M.D.   On: 10/31/2018 12:27   Ct Cervical Spine Wo Contrast  Result Date: 10/31/2018 CLINICAL DATA:  Left-sided facial droop and left-sided weakness. EXAM: CT HEAD WITHOUT CONTRAST CT CERVICAL SPINE WITHOUT CONTRAST TECHNIQUE: Multidetector CT imaging of the head and cervical spine was performed following the standard protocol without intravenous contrast. Multiplanar CT image reconstructions of the cervical spine were also generated. COMPARISON:  CT scan 04/17/2018 FINDINGS: CT HEAD FINDINGS Brain: There is a moderate to large right-sided subdural hematoma in the right frontal and parietal regions. However, because of the underlying atrophy I do not really see any significant mass effect on the right lateral ventricle. The sulci and not compressed and is no subfalcine herniation. There is chronic underlying atrophy, ventriculomegaly and periventricular white matter disease. No findings for hemispheric infarction or parenchymal hemorrhage. The brainstem and cerebellum are grossly normal in stable. Vascular: Advanced vascular calcifications but no obvious aneurysm or hyperdense vessels. Skull: No skull  fracture or bone lesion. Sinuses/Orbits: The paranasal sinuses and mastoid air cells are grossly clear. The globes are intact. Other: No scalp hematoma or laceration. CT CERVICAL SPINE FINDINGS Alignment: Normal overall alignment. There is advanced degenerative cervical spondylosis with multilevel disc disease and facet disease and mild degenerative subluxation of C3. The facets are normally aligned. Skull base and vertebrae: No acute fracture. No primary bone lesion or focal pathologic process. Soft tissues and spinal canal: No prevertebral fluid or swelling. No visible canal hematoma. Disc levels: The spinal canal is generous. No significant spinal stenosis. Mild multilevel foraminal stenosis due to facet disease and uncinate spurring. Upper chest: A right-sided pleural effusion is noted. Calcified granuloma at the right lung apex. Other: No neck mass or adenopathy. Carotid artery  calcifications are noted. IMPRESSION: 1. Moderate-sized right-sided subdural hematoma but without significant mass effect. 2. No CT findings for acute hemispheric infarction or parenchymal hemorrhage. 3. No skull fracture. 4. Degenerative cervical spondylosis but no acute cervical spine fracture. Electronically Signed   By: Marijo Sanes M.D.   On: 10/31/2018 12:27   Mr Jeri Cos DS Contrast  Result Date: 10/31/2018 CLINICAL DATA:  Subdural hematoma. Multiple recent falls. Left-sided facial droop for 2 days. EXAM: MRI HEAD WITHOUT AND WITH CONTRAST TECHNIQUE: Multiplanar, multiecho pulse sequences of the brain and surrounding structures were obtained without and with intravenous contrast. CONTRAST:  7 mL Gadavist COMPARISON:  None. FINDINGS: BRAIN: Punctate focus of abnormal diffusion-weighted intensity within the left thalamus. The midline structures are normal. Right holo hemispheric subdural hematoma measures approximately 16 mm in thickness. There is no midline shift or other mass effect. There is hyperenhancement of the right  convexity dura. Early confluent hyperintense T2-weighted signal of the periventricular and deep white matter, most commonly due to chronic ischemic microangiopathy. Generalized atrophy without lobar predilection. Susceptibility-sensitive sequences show no chronic microhemorrhage or superficial siderosis. No mass lesion. VASCULAR: The major intracranial arterial and venous sinus flow voids are normal. SKULL AND UPPER CERVICAL SPINE: Calvarial bone marrow signal is normal. There is no skull base mass. Visualized upper cervical spine and soft tissues are normal. SINUSES/ORBITS: No fluid levels or advanced mucosal thickening. No mastoid or middle ear effusion. The orbits are normal. IMPRESSION: 1. Right hemispheric subdural hematoma measuring up to 16 mm in thickness without midline shift or other mass effect. 2. Punctate focus of subacute ischemia within the left thalamus. No other recent ischemia. 3. Generalized volume loss and chronic ischemic microangiopathy. Electronically Signed   By: Ulyses Jarred M.D.   On: 10/31/2018 22:25     Scheduled Meds:   stroke: mapping our early stages of recovery book   Does not apply Once   olopatadine  1 drop Both Eyes QHS   timolol  1 drop Both Eyes BID   Continuous Infusions:   LOS: 1 day    Time spent: 25 minutes spent in the coordination of care today.    Jonnie Finner, DO [facility] Pager [phone]  If 7PM-7AM, please contact night-coverage [email or web address] Password [email or web address] 11/02/2018, 7:02 AM

## 2018-11-02 NOTE — Progress Notes (Addendum)
  NEUROSURGERY PROGRESS NOTE   No issues overnight.  Daughter at bedside.   EXAM:  BP 128/74 (BP Location: Right Arm)   Pulse 74   Temp 98.1 F (36.7 C) (Axillary)   Resp 18   Wt 74 kg   SpO2 99%   Lethargic but easily awakened MAEW to command, grossly non-focal facial droop  IMPRESSION/PLAN 85 y.o. male with small right frontal convexity SDH, no MLS. As far as I can tell, he is grossly neurologically stable. I had a long discussion with daughter, Leatrice Jewels, at bedside (15 min) regarding patients current situation. She agrees that should SDH worsen and patient deteriorate, to not proceed with surgical evacuation given his advanced age and hx of dementia. Will get repeat head CT for monitoring purposes at daughter's request, but we did discuss his neuro status is more helpful than the CT if we are not proceeding with NS intervention.   Addendum  Repeat head ct stable with local mass effect and no MLS. Discussed with daughter. Plan to continue supportive care, & plan to not intervene should he decline. No further head CT necessary as it will not change management from NS perspective. Please call for any concerns.

## 2018-11-03 LAB — CBC WITH DIFFERENTIAL/PLATELET
Abs Immature Granulocytes: 0.29 10*3/uL — ABNORMAL HIGH (ref 0.00–0.07)
Basophils Absolute: 0 10*3/uL (ref 0.0–0.1)
Basophils Relative: 0 %
Eosinophils Absolute: 0 10*3/uL (ref 0.0–0.5)
Eosinophils Relative: 0 %
HCT: 42.2 % (ref 39.0–52.0)
Hemoglobin: 13.8 g/dL (ref 13.0–17.0)
Immature Granulocytes: 3 %
Lymphocytes Relative: 9 %
Lymphs Abs: 1.1 10*3/uL (ref 0.7–4.0)
MCH: 30.9 pg (ref 26.0–34.0)
MCHC: 32.7 g/dL (ref 30.0–36.0)
MCV: 94.4 fL (ref 80.0–100.0)
Monocytes Absolute: 3 10*3/uL — ABNORMAL HIGH (ref 0.1–1.0)
Monocytes Relative: 26 %
Neutro Abs: 7.2 10*3/uL (ref 1.7–7.7)
Neutrophils Relative %: 62 %
Platelets: 69 10*3/uL — ABNORMAL LOW (ref 150–400)
RBC: 4.47 MIL/uL (ref 4.22–5.81)
RDW: 14.4 % (ref 11.5–15.5)
WBC: 11.5 10*3/uL — ABNORMAL HIGH (ref 4.0–10.5)
nRBC: 0 % (ref 0.0–0.2)

## 2018-11-03 LAB — RENAL FUNCTION PANEL
Albumin: 2.8 g/dL — ABNORMAL LOW (ref 3.5–5.0)
Anion gap: 8 (ref 5–15)
BUN: 17 mg/dL (ref 8–23)
CO2: 25 mmol/L (ref 22–32)
Calcium: 8.2 mg/dL — ABNORMAL LOW (ref 8.9–10.3)
Chloride: 102 mmol/L (ref 98–111)
Creatinine, Ser: 0.77 mg/dL (ref 0.61–1.24)
GFR calc Af Amer: >60 mL/min (ref >60)
GFR calc non Af Amer: >60 mL/min (ref >60)
Glucose, Bld: 111 mg/dL — ABNORMAL HIGH (ref 70–99)
Phosphorus: 1.8 mg/dL — ABNORMAL LOW (ref 2.5–4.6)
Potassium: 4.1 mmol/L (ref 3.5–5.1)
Sodium: 135 mmol/L (ref 135–145)

## 2018-11-03 LAB — MAGNESIUM: Magnesium: 2.1 mg/dL (ref 1.7–2.4)

## 2018-11-03 MED ORDER — RESOURCE THICKENUP CLEAR PO POWD
ORAL | Status: DC | PRN
  Administered 2018-11-03: 21:00:00 via ORAL
  Filled 2018-11-03: qty 125

## 2018-11-03 MED ORDER — QUETIAPINE FUMARATE 25 MG PO TABS
25.0000 mg | ORAL_TABLET | Freq: Every day | ORAL | Status: DC
  Administered 2018-11-03: 25 mg via ORAL
  Filled 2018-11-03: qty 1

## 2018-11-03 NOTE — TOC Initial Note (Signed)
Transition of Care [facility]) - Initial/Assessment Note    Patient Details  Name: [name] MRN: [id number] Date of Birth: August 25, 1920  Transition of Care [facility]) CM/SW Contact:    Gelene Mink, [place] Phone Number: 11/03/2018, 4:50 PM  Clinical Narrative:               CSW met with the patient's daughter, [name],  at beside then moved to the waiting room to speak privately. The patient is only alert to himself and has early stages of dementia. The patient's daughter was in the room when PT and OT were working with her dad. She is aware that he is a max 2+ assist and needs additional assistance. CSW spoke with [name] about the therapy recommendation. CSW explained the SNF process and answered her questions.   [name] stated that before her dad's fall, he and her mom were independent at home with assistance of private care givers. She stated that she wants the best for her dad and feels that SNF would be the best option for him but she needed to speak with her mom about it before a decision was made. CSW provided a SNF list and Home Health list. CSW explained the home health process. Top facility choices would be [facility], [facility], [facility] and [facility].  CSW stated that she would fax the patient out with her permission and inform her of bed offers once they were available. [name] asked if the CSW could reach out to her on Monday for the decision for disposition.     Expected Discharge Plan: Skilled Nursing Facility Barriers to Discharge: Insurance Authorization, Continued Medical Work up   Patient Goals and CMS Choice Patient states their goals for this hospitalization and ongoing recovery are:: Pt daughter would like to send her dad to rehab post discharge. CMS Medicare.gov Compare Post Acute Care list provided to:: Patient Represenative (must comment)(Pt daughter, [name]) Choice offered to / list presented to : Adult Children  Expected Discharge Plan  and Services Expected Discharge Plan: [place] In-house Referral: Clinical Social Work Discharge Planning Services: NA Post Acute Care Choice: Home Health, [place] Living arrangements for the past 2 months: Single Family Home                                      Prior Living Arrangements/Services Living arrangements for the past 2 months: Single Family Home Lives with:: Spouse Patient language and need for interpreter reviewed:: No Do you feel safe going back to the place where you live?: Yes      Need for Family Participation in Patient Care: Yes (Comment) Care giver support system in place?: Yes (comment) Current home services: Other (comment)(Have private aides that assist pt and wife at home) Criminal Activity/Legal Involvement Pertinent to Current Situation/Hospitalization: No - Comment as needed  Activities of Daily Living      Permission Sought/Granted Permission sought to share information with : Case Manager Permission granted to share information with : Yes, Verbal Permission Granted  Share Information with NAME: [name]  Permission granted to share info w AGENCY: All SNF  Permission granted to share info w Relationship: Daughter     Emotional Assessment Appearance:: Appears stated age Attitude/Demeanor/Rapport: Unable to Assess Affect (typically observed): Unable to Assess Orientation: : Oriented to Self Alcohol / Substance Use: Not Applicable Psych Involvement: No (comment)  Admission diagnosis:  Subdural hematoma (  [place]) [G92.1J9E] Patient Active Problem List   Diagnosis Date Noted  . BPH (benign prostatic hyperplasia) 11/02/2018  . HLD (hyperlipidemia) 11/02/2018  . Memory disorder 11/02/2018  . Glaucoma 11/02/2018  . CVA (cerebral vascular accident) ([place]) 11/02/2018  . Subdural hematoma ([place]) 10/31/2018  . Thrombocytopenia ([place]) 10/31/2018   PCP:  Patient, No Pcp Per Pharmacy:   [facility] [place] [address] Phone: [phone] Fax: [phone]     Social Determinants of Health (SDOH) Interventions    Readmission Risk Interventions No flowsheet data found.

## 2018-11-03 NOTE — NC FL2 (Signed)
[place] LEVEL OF CARE SCREENING TOOL     IDENTIFICATION  Patient Name: [name] Birthdate: 1921-02-20 Sex: male Admission Date (Current Location): 10/31/2018  [facility] and [place] Number:  Herbalist and Address:  [facility] [place], [place] [address] [place], [place] [address]      Provider Number: [id number]  Attending Physician Name and Address:  Jonnie Finner, DO  Relative Name and Phone Number:  Lori Liew, Daughter, [place], Spouse, [phone]    Current Level of Care: Hospital Recommended Level of Care: [place] Prior Approval Number:    Date Approved/Denied:   PASRR Number: Under Manual Review  Discharge Plan: SNF    Current Diagnoses: Patient Active Problem List   Diagnosis Date Noted  . BPH (benign prostatic hyperplasia) 11/02/2018  . HLD (hyperlipidemia) 11/02/2018  . Memory disorder 11/02/2018  . Glaucoma 11/02/2018  . CVA (cerebral vascular accident) ([place]) 11/02/2018  . Subdural hematoma ([place]) 10/31/2018  . Thrombocytopenia ([place]) 10/31/2018    Orientation RESPIRATION BLADDER Height & Weight     Self  Normal Incontinent, External catheter Weight: 151 lb 10.8 oz (68.8 kg) Height:     BEHAVIORAL SYMPTOMS/MOOD NEUROLOGICAL BOWEL NUTRITION STATUS      Incontinent Diet(Dys 1 diet, nectar thick liquids)  AMBULATORY STATUS COMMUNICATION OF NEEDS Skin   Extensive Assist Verbally Bruising, Normal(Arm/face)                       Personal Care Assistance Level of Assistance  Bathing, Feeding, Dressing, Total care Bathing Assistance: Limited assistance Feeding assistance: Limited assistance Dressing Assistance: Limited assistance Total Care Assistance: Limited assistance   Functional Limitations Info  Sight, Hearing, Speech Sight Info: Adequate Hearing Info: Impaired(In both ears) Speech Info: Adequate    SPECIAL CARE FACTORS FREQUENCY  PT (By licensed PT), OT  (By licensed OT), Speech therapy     PT Frequency: 5x/wk OT Frequency: 5x/wk     Speech Therapy Frequency: 2x/wk      Contractures      Additional Factors Info  Code Status, Allergies, Psychotropic Code Status Info: DNR Allergies Info: No Known Allergies Psychotropic Info: Seroquel 25mg  at bedtime         Current Medications (11/03/2018):  This is the current hospital active medication list Current Facility-Administered Medications  Medication Dose Route Frequency Provider Last Rate Last Dose  .  stroke: mapping our early stages of recovery book   Does not apply Once Marylyn Ishihara, Tyrone A, DO      . acetaminophen (TYLENOL) tablet 650 mg  650 mg Oral Q6H PRN Jani Gravel, MD       Or  . acetaminophen (TYLENOL) suppository 650 mg  650 mg Rectal Q6H PRN Jani Gravel, MD      . doxazosin (CARDURA) tablet 2 mg  2 mg Oral Daily Kyle, Tyrone A, DO   2 mg at 11/03/18 0952  . food thickener (THICK IT) powder   Oral PRN Marylyn Ishihara, Tyrone A, DO      . olopatadine (PATANOL) 0.1 % ophthalmic solution 1 drop  1 drop Both Eyes QHS Jani Gravel, MD   1 drop at 11/02/18 2144  . QUEtiapine (SEROQUEL) tablet 25 mg  25 mg Oral QHS Kyle, Tyrone A, DO      . Resource ThickenUp Clear   Oral PRN Cherylann Ratel A, DO      . simvastatin (ZOCOR) tablet 20 mg  20 mg Oral  [id number] Cherylann Ratel A, DO   20 mg at 11/02/18 1802  . timolol (TIMOPTIC) 0.5 % ophthalmic solution 1 drop  1 drop Both Eyes BID Jani Gravel, MD   1 drop at 11/03/18 6789     Discharge Medications: Please see discharge summary for a list of discharge medications.  Relevant Imaging Results:  Relevant Lab Results:   Additional Information SSN: [id number]  Philippa Chester Delia Slatten, LCSWA

## 2018-11-03 NOTE — Progress Notes (Signed)
[name]  PROGRESS NOTE    RITHIK ODEA  GBT:[id number] DOB: 07/11/1920 DOA: 10/31/2018 PCP: Patient, No Pcp Per   Brief Narrative:   Raliegh [age]p y.o.male,w hyperlipidemia,BPH, Glaucoma,? Dementia, apparently had2 falls in the past 2 days. Pt apparently had c/o left sided facial droopfor the past 2 days and generalized weakness. Typically walks with walker , but now unable to walk per his daughter. Didn't seem to use his left hand quite as well. Pt lives with wife and has caretakers. Daughter states took aleve for his back last nite. Otherwise no aspirin or blood thinner.   Assessment & Plan:   Active Problems:   Subdural hematoma (HCC)   Thrombocytopenia (HCC)   BPH (benign prostatic hyperplasia)   HLD (hyperlipidemia)   Memory disorder   Glaucoma   CVA (cerebral vascular accident) ([place])   Subdural hematoma, moderate, R side - Daughter notes he took Aleve last nite, otherwise no aspirin, no blood thinner - MRI brain: 1. Right hemispheric subdural hematoma measuring up to 16 mm in thickness without midline shift or other mass effect. 2. Punctate focus of subacute ischemia within the left thalamus. No other recent ischemia. - NPO  - neurosurgery onboard, repeating CT head     - repeat head CT stable; no further interventions planned  Subacute stroke - MRI brain: Punctate focus left thalamic subacute ischemia - family reports that he had an inital fall about 2 weeks ago - Spoke with neuro; unable to start ASA at this time d/t bleed; no need for carotids; initally therapy evals - neurosurg does not recommend resuming ASA in this pt; will continue to hold     - PT/OT: rec SNF     - SLP: dysphagia 1 (puree, nectar-thick)   Thrombocytopenia (chronic) - follows with pcp - SCDs for DVT PPx  Cardiac murmr - Echo: EF 60-65%  BPH - Cont Cardura 2mg  po qday  Hyperlipidemia - Cont Simvastatin 20mg  po qday   Glaucoma - Cont Timolol  Memory disorder ? Dementia - Hold Seroquel for now     - resumed seroquel at home dosing; dtr reports that he was much more drowsy     - will decrease to 25mg    DVT prophylaxis: SCD Code Status: DNR Family Communication: Spoke with dtr at bedside   Disposition Plan: TBD   Consultants:   Neurosurgery   Subjective: More drowsy per family at bedside; agitated ON.  Objective: Vitals:   11/02/18 1547 11/02/18 2014 11/03/18 0010 11/03/18 0420  BP: 124/72 131/82 121/65 105/66  Pulse: 64 84 66 61  Resp: 18 18 18 20   Temp: 98.5 F (36.9 C) 98.4 F (36.9 C) 97.9 F (36.6 C) 98.7 F (37.1 C)  TempSrc: Axillary Axillary Oral Oral  SpO2: 98% 98% 98% 98%  Weight:    68.8 kg    Intake/Output Summary (Last 24 hours) at 11/03/2018 0727 Last data filed at 11/03/2018 0430 Gross per 24 hour  Intake 240 ml  Output 650 ml  Net -410 ml   Filed Weights   11/01/18 0500 11/02/18 0344 11/03/18 0420  Weight: 74 kg 74 kg 68.8 kg    Examination:  General:85 y.o.maleresting in bed in NAD Cardiovascular: RRR, +S1, S2, no m/g/r, equal pulses throughout Respiratory: CTABL, no w/r/r, normal WOB GI: BS+, NDNT, no masses noted, no organomegaly noted MSK: No e/c/c Skin: No rashes,ulcerations noted; right forehead bruising noted Neuro:somnolent, but somewhat agitated    Data Reviewed: I have personally reviewed following labs and imaging studies.  CBC: Recent Labs  Lab 10/31/18 1057 10/31/18 1119 11/01/18 0913 11/02/18 1228  WBC 5.8  --  8.0 13.3*  NEUTROABS 3.0  --   --  10.2*  HGB 13.6 14.6 13.3 14.9  HCT 42.2 43.0 41.4 46.1  MCV 96.8  --  95.4 94.5  PLT 74*  --  67* 72*   Basic Metabolic Panel: Recent Labs  Lab 10/31/18 1057 10/31/18 1119 11/01/18 0913 11/02/18 1228  NA 139 137 138 136  K 3.9 4.0 4.0 4.0  CL 105 101 105 106  CO2 25  --  25 19*  GLUCOSE 125* 117* 89 103*  BUN 10 11 9 14   CREATININE 0.73 0.70 0.69 0.74   CALCIUM 8.8*  --  8.3* 8.4*  MG  --   --   --  2.0  PHOS  --   --   --  3.2   GFR: CrCl cannot be calculated (Unknown ideal weight.). Liver Function Tests: Recent Labs  Lab 10/31/18 1057 11/01/18 0913 11/02/18 1228  AST 20 21  --   ALT 10 10  --   ALKPHOS 57 53  --   BILITOT 0.9 1.1  --   PROT 6.5 6.2*  --   ALBUMIN 3.6 3.1* 3.1*   No results for input(s): LIPASE, AMYLASE in the last 168 hours. No results for input(s): AMMONIA in the last 168 hours. Coagulation Profile: Recent Labs  Lab 10/31/18 1057  INR 1.2   Cardiac Enzymes: No results for input(s): CKTOTAL, CKMB, CKMBINDEX, TROPONINI in the last 168 hours. BNP (last 3 results) No results for input(s): PROBNP in the last 8760 hours. HbA1C: Recent Labs    11/02/18 1228  HGBA1C 5.7*   CBG: No results for input(s): GLUCAP in the last 168 hours. Lipid Profile: Recent Labs    11/02/18 1228  CHOL 163  HDL 48  LDLCALC 103*  TRIG 61  CHOLHDL 3.4   Thyroid Function Tests: No results for input(s): TSH, T4TOTAL, FREET4, T3FREE, THYROIDAB in the last 72 hours. Anemia Panel: No results for input(s): VITAMINB12, FOLATE, FERRITIN, TIBC, IRON, RETICCTPCT in the last 72 hours. Sepsis Labs: No results for input(s): PROCALCITON, LATICACIDVEN in the last 168 hours.  Recent Results (from the past 240 hour(s))  Novel Coronavirus,NAA,(SEND-OUT TO REF LAB - TAT 24-48 hrs); Hosp Order     Status: None   Collection Time: 10/31/18  1:56 PM   Specimen: Nasopharyngeal Swab; Respiratory  Result Value Ref Range Status   SARS-CoV-2, NAA NOT DETECTED NOT DETECTED Final    Comment: (NOTE) This test was developed and its performance characteristics determined by [employer]. This test has not been FDA cleared or approved. This test has been authorized by FDA under an Emergency Use Authorization (EUA). This test is only authorized for the duration of time the declaration that circumstances exist justifying the  authorization of the emergency use of in vitro diagnostic tests for detection of SARS-CoV-2 virus and/or diagnosis of COVID-19 infection under section 564(b)(1) of the Act, 21 U.S.C. 102HEN-2(D)(7), unless the authorization is terminated or revoked sooner. When diagnostic testing is negative, the possibility of a false negative result should be considered in the context of a patient's recent exposures and the presence of clinical signs and symptoms consistent with COVID-19. An individual without symptoms of COVID-19 and who is not shedding SARS-CoV-2 virus would expect to have a negative (not detected) result in this assay. Performed  At: [facility] [address] [place], [place] [id number] Rush Farmer  MD GM:[id number]    Coronavirus Source NASOPHARYNGEAL  Final    Comment: Performed at [facility], [place] [address], [place], [place] [address]      Radiology Studies: Ct Head Wo Contrast  Result Date: 11/02/2018 CLINICAL DATA:  Subdural hemorrhage. EXAM: CT HEAD WITHOUT CONTRAST TECHNIQUE: Contiguous axial images were obtained from the base of the skull through the vertex without intravenous contrast. COMPARISON:  Head CT and MRI 10/31/2018 FINDINGS: Brain: Mixed density subdural hematoma over the right cerebral convexity is not significantly changed in size measuring up to 13 mm in thickness. There is mild flattening of the underlying brain without midline shift or other significant mass effect. No new intracranial hemorrhage is identified. Moderate cerebral atrophy and moderate chronic small vessel ischemia in the cerebral white matter are again noted. No sizable acute infarct is identified. There are chronic lacunar infarcts in the thalami, and the punctate acute left thalamic infarct on MRI is not differentiated from these on CT. Vascular: Calcified atherosclerosis at the skull base. Skull: No fracture or focal osseous lesion. Sinuses/Orbits: [name] Minimal mucosal small left  mastoid thickening in the paranasal sinuses effusion. Bilateral cataract extraction. Other: None. IMPRESSION: Unchanged right-sided subdural hematoma without midline shift. No new intracranial abnormality. Electronically Signed   By: Logan Bores M.D.   On: 11/02/2018 11:00     Scheduled Meds: .  stroke: mapping our early stages of recovery book   Does not apply Once  . doxazosin  2 mg Oral Daily  . olopatadine  1 drop Both Eyes QHS  . QUEtiapine  50 mg Oral QHS  . simvastatin  20 mg Oral q1800  . timolol  1 drop Both Eyes BID   Continuous Infusions:   LOS: 2 days    Time spent: 25 minutes spent in the coordination of care today.    Jonnie Finner, DO [facility] Pager [phone]  If 7PM-7AM, please contact night-coverage [email or web address] Password [facility] 11/03/2018, 7:27 AM

## 2018-11-03 NOTE — Progress Notes (Signed)
Occupational Therapy Treatment Patient Details Name: [name] MRN: [id number] DOB: [birth year] Today's Date: 11/03/2018    History of present illness this 85 y.o. male admitted wtih Lt sided facial droop and generalized weakness.  He also has had at least two falls.  MRI revealed Rt hemispheric SDH without midline shift or mass effect, punctate focus of subacute inschemia Lt thalamus.  PMH includes:  thrombocytopenia, memory impairment, HTN, hallucinations    OT comments  Pt seen in conjunction with PT.  He was more alert initially, and initially was following commands with ~70% consistency, however, as he fatigued, cognition deteriorated.  He required max A +2, overall, for functional transfers to [facility], and total  A toilet hygiene.  He is able to perform simple grooming today with min - mod A.  Daughter now feels certain that he will need SNF level rehab before they are able to take him home.  Will follow acutely.   Follow Up Recommendations  SNF;Supervision/Assistance - 24 hour    Equipment Recommendations  Hospital bed;Other (comment)    Recommendations for Other Services      Precautions / Restrictions Precautions Precautions: Fall Restrictions Weight Bearing Restrictions: No       Mobility Bed Mobility Overal bed mobility: Needs Assistance Bed Mobility: Supine to Sit     Supine to sit: Mod assist     General bed mobility comments: Pt required assist to initiate movement and move LEs off bed.  He required min A to lift trunk as he needed assist to initiate movement   Transfers Overall transfer level: Needs assistance Equipment used: 2 person hand held assist Transfers: Sit to/from [employer] Transfers Sit to Stand: Max assist;+2 physical assistance;+2 safety/equipment Stand pivot transfers: Max assist;+2 physical assistance;+2 safety/equipment       General transfer comment: Pt initially required mod A +2 to stand, and max A to initiate weight shift and  stepping, but as he fatigued, he progressed to solid max A +2     Balance Overall balance assessment: Needs assistance Sitting-balance support: Feet supported Sitting balance-Leahy Scale: Poor Sitting balance - Comments: Pt requires min A to maintain static sitting intially.  As he fatigued, he leans heavily to the Rt  Postural control: Posterior lean;Right lateral lean Standing balance support: Bilateral upper extremity supported Standing balance-Leahy Scale: Poor Standing balance comment: Pt requires max A +2 overall for  static standing.  However, when he is alert, he was able to stand briefly with min A                            ADL either performed or assessed with clinical judgement   ADL                           Toilet Transfer: Maximal assistance;+2 for physical assistance;+2 for safety/equipment;Stand-pivot;BSC;RW   Toileting- Clothing Manipulation and Hygiene: Total assistance;Sit to/from stand       Functional mobility during ADLs: Maximal assistance;+2 for physical assistance;+2 for safety/equipment;Rolling walker;Cueing for safety;Cueing for sequencing General ADL Comments: Pt incontinent of stool.  Pt requires step by step cues and facilitation to weight shift and step      Vision   Additional Comments: daughter reports pt with visual deficits due to macular degeneration    Perception     Praxis      Cognition Arousal/Alertness: Lethargic Behavior During Therapy: Flat affect Overall Cognitive Status: Impaired/Different  from baseline Area of Impairment: Orientation;Attention;Memory;Following commands;Safety/judgement;Awareness;Problem solving                 Orientation Level: Disoriented to;Place;Time;Situation Current Attention Level: Focused;Sustained   Following Commands: Follows one step commands inconsistently;Follows one step commands with increased time Safety/Judgement: Decreased awareness of safety;Decreased awareness  of deficits   Problem Solving: Slow processing;Decreased initiation;Difficulty sequencing;Requires verbal cues;Requires tactile cues General Comments: Pt initially awake with eyes open.  He makes eye contact and follows one step commands ~70% with cues.   As the session.  As he fatigues, cognition deteriorated         Exercises     Shoulder Instructions       General Comments daughter present    Pertinent Vitals/ Pain       Pain Assessment: Faces Faces Pain Scale: No hurt  Home Living                                          Prior Functioning/Environment              Frequency  Min 2X/week        Progress Toward Goals  OT Goals(current goals can now be found in the care plan section)  Progress towards OT goals: Progressing toward goals  Acute Rehab OT Goals Patient Stated Goal: daughter is hopeful he will get stronger   Plan Discharge plan remains appropriate    Co-evaluation    PT/OT/SLP Co-Evaluation/Treatment: Yes Reason for Co-Treatment: Complexity of the patient's impairments (multi-system involvement);Necessary to address cognition/behavior during functional activity;For patient/therapist safety;To address functional/ADL transfers PT goals addressed during session: Mobility/safety with mobility;Balance;Proper use of DME;Strengthening/ROM OT goals addressed during session: ADL's and self-care      AM-PAC OT "6 Clicks" Daily Activity     Outcome Measure   Help from another person eating meals?: A Lot Help from another person taking care of personal grooming?: A Lot Help from another person toileting, which includes using toliet, bedpan, or urinal?: A Lot Help from another person bathing (including washing, rinsing, drying)?: Total Help from another person to put on and taking off regular upper body clothing?: Total Help from another person to put on and taking off regular lower body clothing?: Total 6 Click Score: 9    End of  Session Equipment Utilized During Treatment: Rolling walker  OT Visit Diagnosis: Unsteadiness on feet (R26.81);Cognitive communication deficit (R41.841)   Activity Tolerance Patient limited by lethargy   Patient Left in chair;with call bell/phone within reach;with chair alarm set;with family/visitor present   Nurse Communication Mobility status        Time: 7048-8891 OT Time Calculation (min): 32 min  Charges: OT General Charges $OT Visit: 1 Visit OT Treatments $Neuromuscular Re-education: 8-22 mins  Lucille Passy, OTR/L [place] Pager [phone] Office [phone]    Lucille Passy M 11/03/2018, 12:44 PM

## 2018-11-03 NOTE — Plan of Care (Signed)
Daughter is at bedside and helping assist with patients needs. No questions or concerns at this time.

## 2018-11-03 NOTE — Progress Notes (Signed)
Physical Therapy Treatment Patient Details Name: [name] MRN: [id number] DOB: Jul 23, 1920 Today's Date: 11/03/2018    History of Present Illness this 85 y.o. male admitted wtih Lt sided facial droop and generalized weakness.  He also has had at least two falls.  MRI revealed Rt hemispheric SDH without midline shift or mass effect, punctate focus of subacute inschemia Lt thalamus.  PMH includes:  thrombocytopenia, memory impairment, HTN, hallucinations     PT Comments    Patient seen for mobility progression. Received in bed with patient much more alert and conversative at beginning of session. Patient agreeable to OOB transfer. Requires heavy verbal and tactile cueing for all aspects of bed mobilty and transfers. Overall, Max A +2 for mobility with heavy verbal and tactile cueing throughout - increased time needed for processing. Stand pivot transfers to recliner and BSC. Will continue to recommend SNF at discharge. PT to continue to follow.    Follow Up Recommendations  SNF;Supervision/Assistance - 24 hour     Equipment Recommendations  Other (comment)(defer)    Recommendations for Other Services       Precautions / Restrictions Precautions Precautions: Fall Restrictions Weight Bearing Restrictions: No    Mobility  Bed Mobility Overal bed mobility: Needs Assistance Bed Mobility: Supine to Sit     Supine to sit: Mod assist     General bed mobility comments: Pt required assist to initiate movement and move LEs off bed.  He required min A to lift trunk as he needed assist to initiate movement   Transfers Overall transfer level: Needs assistance Equipment used: 2 person hand held assist Transfers: Sit to/from [employer] Transfers Sit to Stand: Max assist;+2 physical assistance;+2 safety/equipment Stand pivot transfers: Max assist;+2 physical assistance;+2 safety/equipment       General transfer comment: Pt initially required mod A +2 to stand, and max A to  initiate weight shift and stepping, but as he fatigued, he progressed to solid max A +2   Ambulation/Gait             General Gait Details: patient taking steps with max verbal and tactile cueing   Stairs             Wheelchair Mobility    Modified Rankin (Stroke Patients Only)       Balance Overall balance assessment: Needs assistance Sitting-balance support: Feet supported Sitting balance-Leahy Scale: Poor Sitting balance - Comments: Pt requires min A to maintain static sitting intially.  As he fatigued, he leans heavily to the Rt  Postural control: Posterior lean;Right lateral lean Standing balance support: Bilateral upper extremity supported Standing balance-Leahy Scale: Poor Standing balance comment: Pt requires max A +2 overall for  static standing.  However, when he is alert, he was able to stand briefly with min A                             Cognition Arousal/Alertness: Lethargic Behavior During Therapy: Flat affect Overall Cognitive Status: Impaired/Different from baseline Area of Impairment: Orientation;Attention;Memory;Following commands;Safety/judgement;Awareness;Problem solving                 Orientation Level: Disoriented to;Place;Time;Situation Current Attention Level: Focused;Sustained   Following Commands: Follows one step commands inconsistently;Follows one step commands with increased time Safety/Judgement: Decreased awareness of safety;Decreased awareness of deficits   Problem Solving: Slow processing;Decreased initiation;Difficulty sequencing;Requires verbal cues;Requires tactile cues General Comments: Pt initially awake with eyes open.  He makes eye contact and follows  one step commands ~70% with cues.   As the session.  As he fatigues, cognition deteriorated       Exercises      General Comments General comments (skin integrity, edema, etc.): daughter present      Pertinent Vitals/Pain Pain Assessment:  Faces Faces Pain Scale: No hurt    Home Living                      Prior Function            PT Goals (current goals can now be found in the care plan section) Acute Rehab PT Goals Patient Stated Goal: daughter is hopeful he will get stronger  PT Goal Formulation: With patient Time For Goal Achievement: 11/16/18 Potential to Achieve Goals: Fair Progress towards PT goals: Progressing toward goals    Frequency    Min 3X/week      PT Plan Current plan remains appropriate    Co-evaluation PT/OT/SLP Co-Evaluation/Treatment: Yes Reason for Co-Treatment: Complexity of the patient's impairments (multi-system involvement);Necessary to address cognition/behavior during functional activity;For patient/therapist safety;To address functional/ADL transfers PT goals addressed during session: Mobility/safety with mobility;Balance;Proper use of DME;Strengthening/ROM OT goals addressed during session: ADL's and self-care      AM-PAC PT "6 Clicks" Mobility   Outcome Measure  Help needed turning from your back to your side while in a flat bed without using bedrails?: A Lot Help needed moving from lying on your back to sitting on the side of a flat bed without using bedrails?: A Lot Help needed moving to and from a bed to a chair (including a wheelchair)?: A Lot Help needed standing up from a chair using your arms (e.g., wheelchair or bedside chair)?: A Lot Help needed to walk in hospital room?: Total Help needed climbing 3-5 steps with a railing? : Total 6 Click Score: 10    End of Session Equipment Utilized During Treatment: Gait belt Activity Tolerance: Patient tolerated treatment well;Patient limited by lethargy Patient left: in chair;with call bell/phone within reach;with chair alarm set;with family/visitor present Nurse Communication: Mobility status PT Visit Diagnosis: Unsteadiness on feet (R26.81);Other abnormalities of gait and mobility (R26.89);Muscle weakness  (generalized) (M62.81);History of falling (Z91.81)     Time: 1030-1106 PT Time Calculation (min) (ACUTE ONLY): 36 min  Charges:  $Therapeutic Activity: 8-22 mins                      Lanney Gins, PT, DPT Supplemental Physical Therapist 11/03/18 11:40 AM Pager: [phone] Office: [phone]

## 2018-11-04 ENCOUNTER — Inpatient Hospital Stay (HOSPITAL_COMMUNITY): Payer: Medicare HMO

## 2018-11-04 LAB — CBC WITH DIFFERENTIAL/PLATELET
Abs Immature Granulocytes: 0.56 10*3/uL — ABNORMAL HIGH (ref 0.00–0.07)
Basophils Absolute: 0 10*3/uL (ref 0.0–0.1)
Basophils Relative: 0 %
Eosinophils Absolute: 0 10*3/uL (ref 0.0–0.5)
Eosinophils Relative: 0 %
HCT: 39.2 % (ref 39.0–52.0)
Hemoglobin: 12.9 g/dL — ABNORMAL LOW (ref 13.0–17.0)
Immature Granulocytes: 3 %
Lymphocytes Relative: 7 %
Lymphs Abs: 1.3 10*3/uL (ref 0.7–4.0)
MCH: 30.9 pg (ref 26.0–34.0)
MCHC: 32.9 g/dL (ref 30.0–36.0)
MCV: 93.8 fL (ref 80.0–100.0)
Monocytes Absolute: 2.4 10*3/uL — ABNORMAL HIGH (ref 0.1–1.0)
Monocytes Relative: 13 %
Neutro Abs: 14 10*3/uL — ABNORMAL HIGH (ref 1.7–7.7)
Neutrophils Relative %: 77 %
Platelets: 56 10*3/uL — ABNORMAL LOW (ref 150–400)
RBC: 4.18 MIL/uL — ABNORMAL LOW (ref 4.22–5.81)
RDW: 14.4 % (ref 11.5–15.5)
WBC: 18.3 10*3/uL — ABNORMAL HIGH (ref 4.0–10.5)
nRBC: 0 % (ref 0.0–0.2)

## 2018-11-04 LAB — RENAL FUNCTION PANEL
Albumin: 2.6 g/dL — ABNORMAL LOW (ref 3.5–5.0)
Anion gap: 10 (ref 5–15)
BUN: 23 mg/dL (ref 8–23)
CO2: 24 mmol/L (ref 22–32)
Calcium: 8.2 mg/dL — ABNORMAL LOW (ref 8.9–10.3)
Chloride: 106 mmol/L (ref 98–111)
Creatinine, Ser: 1 mg/dL (ref 0.61–1.24)
GFR calc Af Amer: >60 mL/min (ref >60)
GFR calc non Af Amer: >60 mL/min (ref >60)
Glucose, Bld: 123 mg/dL — ABNORMAL HIGH (ref 70–99)
Phosphorus: 3.1 mg/dL (ref 2.5–4.6)
Potassium: 3.5 mmol/L (ref 3.5–5.1)
Sodium: 140 mmol/L (ref 135–145)

## 2018-11-04 LAB — URINALYSIS, ROUTINE W REFLEX MICROSCOPIC

## 2018-11-04 LAB — URINALYSIS, MICROSCOPIC (REFLEX)
RBC / HPF: NONE SEEN RBC/hpf (ref 0–5)
WBC, UA: >50 WBC/hpf (ref 0–5)

## 2018-11-04 MED ORDER — SODIUM CHLORIDE 0.9 % IV SOLN
3.0000 g | Freq: Two times a day (BID) | INTRAVENOUS | Status: DC
  Administered 2018-11-04: 3 g via INTRAVENOUS
  Filled 2018-11-04 (×2): qty 3

## 2018-11-04 MED ORDER — SODIUM CHLORIDE 0.9 % IV SOLN
3.0000 g | Freq: Three times a day (TID) | INTRAVENOUS | Status: DC
  Administered 2018-11-04 – 2018-11-06 (×6): 3 g via INTRAVENOUS
  Filled 2018-11-04 (×9): qty 3

## 2018-11-04 MED ORDER — SODIUM CHLORIDE 0.9 % IV SOLN
INTRAVENOUS | Status: DC
  Administered 2018-11-04 – 2018-11-06 (×2): via INTRAVENOUS

## 2018-11-04 NOTE — TOC Progression Note (Signed)
Transition of Care [facility]) - Progression Note    Patient Details  Name: [name] MRN: [id number] Date of Birth: 1920/10/07  Transition of Care [facility]) CM/SW [place],  Phone Number: 11/04/2018, 12:57 PM  Clinical Narrative:   Patient has been accepted at [facility], and they will have a bed for the patient when he is ready to discharge. Daughter has agreed to accept the bed offer. Pennybyrn initiating insurance authorization after therapy updates received today. CSW to follow.    Expected Discharge Plan: Skilled Nursing Facility Barriers to Discharge: Ship broker, Continued Medical Work up  Expected Discharge Plan and Services Expected Discharge Plan: [place] In-house Referral: Clinical Social Work Discharge Planning Services: NA Post Acute Care Choice: Home Health, [place] Living arrangements for the past 2 months: Single Family Home                                       Social Determinants of Health (SDOH) Interventions    Readmission Risk Interventions No flowsheet data found.

## 2018-11-04 NOTE — Progress Notes (Signed)
[name]  PROGRESS NOTE    NATION CRADLE  ZOX:[id number] DOB: [birth year] DOA: 10/31/2018 PCP: Patient, No Pcp Per   Brief Narrative:   [name] y.o.male,w hyperlipidemia,BPH, Glaucoma,? Dementia, apparently had2 falls in the past 2 days. Pt apparently had c/o left sided facial droopfor the past 2 days and generalized weakness. Typically walks with walker , but now unable to walk per his daughter. Didn't seem to use his left hand quite as well. Pt lives with wife and has caretakers. Daughter states took aleve for his back last nite. Otherwise no aspirin or blood thinner.   Assessment & Plan:   Active Problems:   Subdural hematoma (HCC)   Thrombocytopenia (HCC)   BPH (benign prostatic hyperplasia)   HLD (hyperlipidemia)   Memory disorder   Glaucoma   CVA (cerebral vascular accident) ([place])   Subdural hematoma, moderate, R side - Daughter notes he took Aleve last nite, otherwise no aspirin, no blood thinner - MRI brain: 1. Right hemispheric subdural hematoma measuring up to 16 mm in thickness without midline shift or other mass effect. 2. Punctate focus of subacute ischemia within the left thalamus. No other recent ischemia. - NPO  - neurosurgery onboard,repeating CT head     - repeat head CT stable; no further interventions planned  Subacute stroke - MRI brain: Punctate focus left thalamic subacute ischemia - family reports that he had an inital fall about 2 weeks ago - Spoke with neuro; unable to start ASA at this time d/t bleed; no need for carotids; initally therapy evals - neurosurg does not recommend resuming ASA in this pt; will continue to hold     - PT/OT: rec SNF     - SLP: dysphagia 1 (puree, nectar-thick)   Thrombocytopenia (chronic) - follows with pcp - SCDs for DVT PPx  Cardiac murmr - Echo: EF 60-65%  BPH - Cont Cardura 2mg  po qday  Hyperlipidemia - Cont Simvastatin 20mg  po qday   Glaucoma - Cont Timolol  Memory disorder ? Dementia - Hold Seroquel for now     - resumed seroquel at home dosing; dtr reports that he was much more drowsy     - now on seroquel  25mg   Dysphagia     - SLP: dysphagia 1 diet     - WBC up today, O2 sats about the same, however, question asp pna/pneumonitis     - check CXR  Leukocytosis     - ?asp pna; check CXR     - check UA, Bld Cx     - afebrile, but hypotensive ON  DVT prophylaxis:SCD Code Status:DNR Family Communication:Spoke with dtr at bedside Disposition Plan:TBD   Consultants:   Neurosurgery   Subjective: Resting better per dtr; hypotensive ON  Objective: Vitals:   11/03/18 2339 11/03/18 2345 11/03/18 2346 11/04/18 0358  BP: (!) 88/48 (!) 91/47 (!) 82/41 (!) 104/38  Pulse: 88 88 69 (!) 54  Resp: 18   18  Temp: 99 F (37.2 C)   97.9 F (36.6 C)  TempSrc: Oral   Oral  SpO2: 93%     Weight:        Intake/Output Summary (Last 24 hours) at 11/04/2018 0721 Last data filed at 11/03/2018 2341 Gross per 24 hour  Intake 480 ml  Output 650 ml  Net -170 ml   Filed Weights   11/01/18 0500 11/02/18 0344 11/03/18 0420  Weight: 74 kg 74 kg 68.8 kg    Examination:  General:85 y.o.maleresting in bed in NAD  Cardiovascular: RRR, +S1, S2, no m/g/r, equal pulses throughout Respiratory: CTABL, no w/r/r, normal WOB GI: BS+, NDNT, no masses noted, no organomegaly noted MSK: No e/c/c Skin: No rashes,ulcerations noted; right forehead bruising noted Neuro:somnolent    Data Reviewed: I have personally reviewed following labs and imaging studies.  CBC: Recent Labs  Lab 10/31/18 1057 10/31/18 1119 11/01/18 0913 11/02/18 1228 11/03/18 0654  WBC 5.8  --  8.0 13.3* 11.5*  NEUTROABS 3.0  --   --  10.2* 7.2  HGB 13.6 14.6 13.3 14.9 13.8  HCT 42.2 43.0 41.4 46.1 42.2  MCV 96.8  --  95.4 94.5 94.4  PLT 74*  --  67* 72* 69*   Basic Metabolic Panel: Recent Labs  Lab 10/31/18 1057 10/31/18  1119 11/01/18 0913 11/02/18 1228 11/03/18 0654  NA 139 137 138 136 135  K 3.9 4.0 4.0 4.0 4.1  CL 105 101 105 106 102  CO2 25  --  25 19* 25  GLUCOSE 125* 117* 89 103* 111*  BUN 10 11 9 14 17   CREATININE 0.73 0.70 0.69 0.74 0.77  CALCIUM 8.8*  --  8.3* 8.4* 8.2*  MG  --   --   --  2.0 2.1  PHOS  --   --   --  3.2 1.8*   GFR: CrCl cannot be calculated (Unknown ideal weight.). Liver Function Tests: Recent Labs  Lab 10/31/18 1057 11/01/18 0913 11/02/18 1228 11/03/18 0654  AST 20 21  --   --   ALT 10 10  --   --   ALKPHOS 57 53  --   --   BILITOT 0.9 1.1  --   --   PROT 6.5 6.2*  --   --   ALBUMIN 3.6 3.1* 3.1* 2.8*   No results for input(s): LIPASE, AMYLASE in the last 168 hours. No results for input(s): AMMONIA in the last 168 hours. Coagulation Profile: Recent Labs  Lab 10/31/18 1057  INR 1.2   Cardiac Enzymes: No results for input(s): CKTOTAL, CKMB, CKMBINDEX, TROPONINI in the last 168 hours. BNP (last 3 results) No results for input(s): PROBNP in the last 8760 hours. HbA1C: Recent Labs    11/02/18 1228  HGBA1C 5.7*   CBG: No results for input(s): GLUCAP in the last 168 hours. Lipid Profile: Recent Labs    11/02/18 1228  CHOL 163  HDL 48  LDLCALC 103*  TRIG 61  CHOLHDL 3.4   Thyroid Function Tests: No results for input(s): TSH, T4TOTAL, FREET4, T3FREE, THYROIDAB in the last 72 hours. Anemia Panel: No results for input(s): VITAMINB12, FOLATE, FERRITIN, TIBC, IRON, RETICCTPCT in the last 72 hours. Sepsis Labs: No results for input(s): PROCALCITON, LATICACIDVEN in the last 168 hours.  Recent Results (from the past 240 hour(s))  Novel Coronavirus,NAA,(SEND-OUT TO REF LAB - TAT 24-48 hrs); Hosp Order     Status: None   Collection Time: 10/31/18  1:56 PM   Specimen: Nasopharyngeal Swab; Respiratory  Result Value Ref Range Status   SARS-CoV-2, NAA NOT DETECTED NOT DETECTED Final    Comment: (NOTE) This test was developed and its performance  characteristics determined by [employer]. This test has not been FDA cleared or approved. This test has been authorized by FDA under an Emergency Use Authorization (EUA). This test is only authorized for the duration of time the declaration that circumstances exist justifying the authorization of the emergency use of in vitro diagnostic tests for detection of SARS-CoV-2 virus and/or diagnosis of COVID-19 infection under  section 564(b)(1) of the Act, 21 U.S.C. 462MMN-8(T)(7), unless the authorization is terminated or revoked sooner. When diagnostic testing is negative, the possibility of a false negative result should be considered in the context of a patient's recent exposures and the presence of clinical signs and symptoms consistent with COVID-19. An individual without symptoms of COVID-19 and who is not shedding SARS-CoV-2 virus would expect to have a negative (not detected) result in this assay. Performed  At: [facility] [address] [place], [place] [id number] Rush Farmer MD YB:[id number]    [place]  Final    Comment: Performed at [facility], [place] [address] [place], [place] [address]         Radiology Studies: Ct Head Wo Contrast  Result Date: 11/02/2018 CLINICAL DATA:  Subdural hemorrhage. EXAM: CT HEAD WITHOUT CONTRAST TECHNIQUE: Contiguous axial images were obtained from the base of the skull through the vertex without intravenous contrast. COMPARISON:  Head CT and MRI 10/31/2018 FINDINGS: Brain: Mixed density subdural hematoma over the right cerebral convexity is not significantly changed in size measuring up to 13 mm in thickness. There is mild flattening of the underlying brain without midline shift or other significant mass effect. No new intracranial hemorrhage is identified. Moderate cerebral atrophy and moderate chronic small vessel ischemia in the cerebral white matter are again noted. No sizable acute infarct  is identified. There are chronic lacunar infarcts in the thalami, and the punctate acute left thalamic infarct on MRI is not differentiated from these on CT. Vascular: Calcified atherosclerosis at the skull base. Skull: No fracture or focal osseous lesion. Sinuses/Orbits: [name] Kitchen Minimal mucosal small left mastoid thickening in the paranasal sinuses effusion. Bilateral cataract extraction. Other: None. IMPRESSION: Unchanged right-sided subdural hematoma without midline shift. No new intracranial abnormality. Electronically Signed   By: Logan Bores M.D.   On: 11/02/2018 11:00        Scheduled Meds: .  stroke: mapping our early stages of recovery book   Does not apply Once  . doxazosin  2 mg Oral Daily  . olopatadine  1 drop Both Eyes QHS  . QUEtiapine  25 mg Oral QHS  . simvastatin  20 mg Oral q1800  . timolol  1 drop Both Eyes BID   Continuous Infusions:   LOS: 3 days    Time spent: 25 minutes spent in the coordination of care today.     Jonnie Finner, DO [facility] Pager [phone]  If 7PM-7AM, please contact night-coverage [email or web address] Password [facility] 11/04/2018, 7:21 AM

## 2018-11-04 NOTE — Progress Notes (Signed)
  Speech Language Pathology Treatment: Dysphagia  Patient Details Name: [name] MRN: [id number] DOB: December 22, 1920 Today's Date: 11/04/2018 Time: 3614-4315 SLP Time Calculation (min) (ACUTE ONLY): 10 min  Assessment / Plan / Recommendation Clinical Impression  Pt today is sleepy but awoke to SLP verbal stimulation.  No overt indication of aspiration with minimal po (dys1/nectar) however pt's baseline cough is very weak.   Also question if he could be aspirating secretions at this time.  Can not rule out aspiration and given WBC elevation as well as prior UGI showing prominent cricopharyngeus in 2013, MBS would allow viewing of oropharyngeal and cervical esophageal structures and function.    Spoke to MD who advised to proceed with MBS and that he would make pt npo.  Asked RN to set up oral suction for use given pt orally pocketing eggs on left posterior oral cavity.      MBS today. No family present to educate but pt informed of plan.    HPI HPI: [name]  is a 85 y.o. male, w hyperlipidemia, BPH, Glaucoma, ? Dementia, apparently had 2 falls in the past 2 days.  Pt apparently had c/o left sided facial droop for the past 2 days and generalized weakness. Typically walks with walker , but now unable to walk per his daughter.  Didn't seem to use his left hand quite as well.  Pt lives with wife and has caretakers.  MRI revealed Right hemispheric subdural hematoma measuring up to 16 mm in thickness without midline shift or other mass effect, punctate focus of subacute ischemia within the left thalamus, no other recent ischemia and generalized volume loss and chronic ischemic microangiopathy. Pt underwent BSE and was placed on dys1/nectar diet.  Today his WBC is up and concern for aspiration is present.  Follow up for dysphagia indicated.      SLP Plan  Continue with current plan of care;MBS(consider MBS due to pt's WBC elevation and weak nonprotective cough)       Recommendations  Diet  recommendations: Dysphagia 1 (puree);Nectar-thick liquid Liquids provided via: Cup;Teaspoon Medication Administration: Crushed with puree Supervision: Staff to assist with self feeding;Full supervision/cueing for compensatory strategies Compensations: Slow rate;Small sips/bites;Other (Comment)(oral suction after meals, egg residuals noted in left posterior oral cavity, cleared with applesauce intake) Postural Changes and/or Swallow Maneuvers: Seated upright 90 degrees;Upright 30-60 min after meal                Oral Care Recommendations: Oral care QID Follow up Recommendations: Other (comment)(tbd) SLP Visit Diagnosis: Dysphagia, unspecified (R13.10) Plan: Continue with current plan of care;MBS(consider MBS due to pt's WBC elevation and weak nonprotective cough)       GO                [name] 11/04/2018, 11:16 AM  [name], MS [facility] SLP Acute Rehab Services Pager [phone] Office [phone]

## 2018-11-04 NOTE — Progress Notes (Signed)
Pharmacy Antibiotic Note  [name] is a 85 y.o. male admitted on 10/31/2018 with SDH. Pharmacy has been consulted for unasyn dosing for possible pneumonia. Pt is afebrile but WBC is elevated at 18.3. SCr is WNL at 1 but up slightly.   Plan: Unasyn 3gm IV Q12H - start after blood cultures drawn F/u renal fxn, C&S, clinical status   Weight: 151 lb 10.8 oz (68.8 kg)  Temp (24hrs), Avg:98.8 F (37.1 C), Min:97.9 F (36.6 C), Max:99.9 F (37.7 C)  Recent Labs  Lab 10/31/18 1057 10/31/18 1119 11/01/18 0913 11/02/18 1228 11/03/18 0654 11/04/18 0819  WBC 5.8  --  8.0 13.3* 11.5* 18.3*  CREATININE 0.73 0.70 0.69 0.74 0.77 1.00    CrCl cannot be calculated (Unknown ideal weight.).    No Known Allergies  Antimicrobials this admission: Unasyn 6/15>>  Dose adjustments this admission: N/A  Microbiology results: Pending  Thank you for allowing pharmacy to be a part of this patient's care.  Cinthya Bors, Rande Lawman 11/04/2018 10:57 AM

## 2018-11-04 NOTE — Progress Notes (Signed)
  Speech Language Pathology Treatment: Dysphagia  Patient Details Name: [name] MRN: [id number] DOB: [birth year] Today's Date: 11/04/2018 Time: 2800-3491 SLP Time Calculation (min) (ACUTE ONLY): 23 min  Assessment / Plan / Recommendation Clinical Impression  SLP educated daughter [name] in detail to pt's dysphagia, reviewed MBS as well as a normal MBS study with [name], risk factors for aspiration pneumonia reviewed as well as pt's gross weakness contributing to asp pna risk, daughter reports being certain pt will not want PEG and she suspects advanced directive indicate as much; She reports understanding to information SlP provided;  Observed daughter providing pt with oral moisture via toothette - with min verbal/visual cues; pt with delayed weak cough - highly suspicious of aspiration,   Advised to continue oral care and moisture, introduced concept of comfort feeding with pt/daughter, Daughter expressed concern re: change in pt's function but SlP also reviewed prior UGI in 2013 showing prominent cricopharyngeus with suspicion of some chronic dysphagia with exacerbation from falls, SDH.  Suspect he managed chronic dysphagia prior to this admit.      HPI HPI: [name]  is a 85 y.o. male, w hyperlipidemia, BPH, Glaucoma, memory disorder per Md notes, apparently had 2 falls in the past 2 days.  Pt apparently had c/o left sided facial droop for the past 2 days and generalized weakness. Typically walks with walker , but now unable to walk per his daughter.  Didn't seem to use his left hand quite as well.  Pt lives with wife and has caretakers.  MRI revealed Right hemispheric subdural hematoma measuring up to 16 mm in thickness without midline shift or other mass effect, punctate focus of subacute ischemia within the left thalamus, no other recent ischemia and generalized volume loss and chronic ischemic microangiopathy. Pt underwent BSE and was placed on dys1/nectar diet.  Today his WBC is up and  concern for aspiration is present.  Follow up for dysphagia indicated, MBS completed.  CXR today showed RML and RLL pna 6/15.      SLP Plan  Continue with current plan of care       Recommendations  Diet recommendations: NPO(oral moisture) Liquids provided via: Cup;Teaspoon Medication Administration: Crushed with puree Supervision: Staff to assist with self feeding;Full supervision/cueing for compensatory strategies Compensations: Small sips/bites;Slow rate;Follow solids with liquid Postural Changes and/or Swallow Maneuvers: Seated upright 90 degrees;Upright 30-60 min after meal          Oral moisture via toothette      Oral Care Recommendations: Oral care QID Follow up Recommendations: (tbd) SLP Visit Diagnosis: Dysphagia, oropharyngeal phase (R13.12) Plan: Continue with current plan of care       GO                [name] 11/04/2018, 2:29 PM   [name], [facility] SLP [place] Pager [phone] Office [phone]

## 2018-11-04 NOTE — Care Management Important Message (Signed)
Important Message  Patient Details  Name: [name] MRN: [id number] Date of Birth: 07-12-1920   Medicare Important Message Given:  Yes    Orbie Pyo 11/04/2018, 2:16 PM

## 2018-11-04 NOTE — Progress Notes (Addendum)
Modified Barium Swallow Progress Note  Patient Details  Name: [name] MRN: [id number] Date of Birth: 1920-09-30  Today's Date: 11/04/2018  Modified Barium Swallow completed.  Full report located under Chart Review in the Imaging Section.  Brief recommendations include the following:  Clinical Impression  Patient presents with moderate oral and severe pharyngeal dysphagia with gross weakness.  Poor pharyngeal contraction, tongue base retraction and laryngeal elevation/closure contributes to residuals and laryngeal penetration.  Pt has NO sensation to residuals.   Aspiration of thin occured due to decreased laryngeal closure and swallow trigger at pyriform sinus. Weak reflexive cough did not clear aspirates.  Various postures including chin tuck, head turn left did NOT help to prevent or decrease residuals.   Pt is most certainly aspirating residuals and is not clearing with weak cough.  Recommend consider palliative consult to establish goals of care with this 85 yo man.   Do not recommend PEG as burden of PEG is high for non-proven benefit.  Hopefully family may determine comfort po diet appropriate.    Upon esophageal sweep,minimal residuals distally noted. No radiologist present to confirm findings.  His largest source of dysphagia is pharyngeal, cervical esophageal.   Swallow Evaluation Recommendations       SLP Diet Recommendations: NPO except meds;Ice chips PRN after oral care   Liquid Administration via: Cup;Straw   Medication Administration: Crushed with puree       Compensations: Small sips/bites;Slow rate   Postural Changes: Remain semi-upright after after feeds/meals (Comment);Seated upright at 90 degrees   Oral Care Recommendations: Oral care QID   Other Recommendations: Have oral suction available   [name], MS [facility] SLP Acute Rehab Services Pager [phone] Office [phone]  [name] 11/04/2018,1:17 PM

## 2018-11-05 DIAGNOSIS — Z515 Encounter for palliative care: Secondary | ICD-10-CM

## 2018-11-05 DIAGNOSIS — Z7189 Other specified counseling: Secondary | ICD-10-CM

## 2018-11-05 DIAGNOSIS — R451 Restlessness and agitation: Secondary | ICD-10-CM

## 2018-11-05 DIAGNOSIS — J690 Pneumonitis due to inhalation of food and vomit: Secondary | ICD-10-CM

## 2018-11-05 LAB — CBC WITH DIFFERENTIAL/PLATELET
Abs Immature Granulocytes: 0.69 10*3/uL — ABNORMAL HIGH (ref 0.00–0.07)
Basophils Absolute: 0 10*3/uL (ref 0.0–0.1)
Basophils Relative: 0 %
Eosinophils Absolute: 0 10*3/uL (ref 0.0–0.5)
Eosinophils Relative: 0 %
HCT: 36 % — ABNORMAL LOW (ref 39.0–52.0)
Hemoglobin: 11.8 g/dL — ABNORMAL LOW (ref 13.0–17.0)
Immature Granulocytes: 4 %
Lymphocytes Relative: 3 %
Lymphs Abs: 0.7 10*3/uL (ref 0.7–4.0)
MCH: 30.6 pg (ref 26.0–34.0)
MCHC: 32.8 g/dL (ref 30.0–36.0)
MCV: 93.3 fL (ref 80.0–100.0)
Monocytes Absolute: 1.9 10*3/uL — ABNORMAL HIGH (ref 0.1–1.0)
Monocytes Relative: 10 %
Neutro Abs: 16.5 10*3/uL — ABNORMAL HIGH (ref 1.7–7.7)
Neutrophils Relative %: 83 %
Platelets: 53 10*3/uL — ABNORMAL LOW (ref 150–400)
RBC: 3.86 MIL/uL — ABNORMAL LOW (ref 4.22–5.81)
RDW: 14.6 % (ref 11.5–15.5)
WBC: 19.8 10*3/uL — ABNORMAL HIGH (ref 4.0–10.5)
nRBC: 0 % (ref 0.0–0.2)

## 2018-11-05 LAB — RENAL FUNCTION PANEL
Albumin: 2.5 g/dL — ABNORMAL LOW (ref 3.5–5.0)
Anion gap: 9 (ref 5–15)
BUN: 27 mg/dL — ABNORMAL HIGH (ref 8–23)
CO2: 23 mmol/L (ref 22–32)
Calcium: 7.9 mg/dL — ABNORMAL LOW (ref 8.9–10.3)
Chloride: 109 mmol/L (ref 98–111)
Creatinine, Ser: 0.79 mg/dL (ref 0.61–1.24)
GFR calc Af Amer: >60 mL/min (ref >60)
GFR calc non Af Amer: >60 mL/min (ref >60)
Glucose, Bld: 112 mg/dL — ABNORMAL HIGH (ref 70–99)
Phosphorus: 3.1 mg/dL (ref 2.5–4.6)
Potassium: 3.3 mmol/L — ABNORMAL LOW (ref 3.5–5.1)
Sodium: 141 mmol/L (ref 135–145)

## 2018-11-05 LAB — MAGNESIUM: Magnesium: 2 mg/dL (ref 1.7–2.4)

## 2018-11-05 MED ORDER — LORAZEPAM 2 MG/ML IJ SOLN
0.5000 mg | Freq: Two times a day (BID) | INTRAMUSCULAR | Status: DC
  Administered 2018-11-05 – 2018-11-06 (×2): 0.5 mg via INTRAVENOUS
  Filled 2018-11-05 (×2): qty 1

## 2018-11-05 MED ORDER — LORAZEPAM 2 MG/ML IJ SOLN
0.5000 mg | Freq: Once | INTRAMUSCULAR | Status: AC
  Administered 2018-11-05: 0.5 mg via INTRAVENOUS
  Filled 2018-11-05: qty 1

## 2018-11-05 MED ORDER — LORAZEPAM 2 MG/ML IJ SOLN: 0.5000 mg | INTRAMUSCULAR | Status: DC | PRN

## 2018-11-05 MED ORDER — LORAZEPAM 2 MG/ML PO CONC: 0.5000 mg | ORAL | Status: DC | PRN

## 2018-11-05 MED ORDER — LORAZEPAM 2 MG/ML PO CONC: 0.5000 mg | Freq: Two times a day (BID) | ORAL | Status: DC

## 2018-11-05 MED ORDER — HALOPERIDOL LACTATE 2 MG/ML PO CONC
4.0000 mg | Freq: Four times a day (QID) | ORAL | Status: DC | PRN
  Filled 2018-11-05: qty 2

## 2018-11-05 MED ORDER — HALOPERIDOL LACTATE 5 MG/ML IJ SOLN
2.0000 mg | Freq: Once | INTRAMUSCULAR | Status: AC
  Administered 2018-11-05: 12:00:00 2 mg via INTRAVENOUS
  Filled 2018-11-05: qty 1

## 2018-11-05 MED ORDER — MORPHINE SULFATE (CONCENTRATE) 10 MG/0.5ML PO SOLN
5.0000 mg | ORAL | Status: DC | PRN
  Administered 2018-11-05: 5 mg via SUBLINGUAL
  Filled 2018-11-05: qty 0.5

## 2018-11-05 MED ORDER — LORAZEPAM 2 MG/ML IJ SOLN: 0.5000 mg | Freq: Two times a day (BID) | INTRAMUSCULAR | Status: DC

## 2018-11-05 MED ORDER — VALPROATE SODIUM 500 MG/5ML IV SOLN
500.0000 mg | Freq: Two times a day (BID) | INTRAVENOUS | Status: DC
  Administered 2018-11-05: 09:00:00 500 mg via INTRAVENOUS
  Filled 2018-11-05 (×2): qty 5

## 2018-11-05 NOTE — Progress Notes (Addendum)
[name]  PROGRESS NOTE    [name]  WUJ:[id number] DOB: [birth year] DOA: 10/31/2018 PCP: Patient, No Pcp Per   Brief Narrative:   [name] y.o.male,w hyperlipidemia,BPH, Glaucoma,? Dementia, apparently had2 falls in the past 2 days. Pt apparently had c/o left sided facial droopfor the past 2 days and generalized weakness. Typically walks with walker , but now unable to walk per his daughter. Didn't seem to use his left hand quite as well. Pt lives with wife and has caretakers. Daughter states took aleve for his back last nite. Otherwise no aspirin or blood thinner.   Assessment & Plan:   Active Problems:   Subdural hematoma (HCC)   Thrombocytopenia (HCC)   BPH (benign prostatic hyperplasia)   HLD (hyperlipidemia)   Memory disorder   Glaucoma   CVA (cerebral vascular accident) ([place])   Subdural hematoma, moderate, R side - Daughter notes he took Aleve last nite, otherwise no aspirin, no blood thinner - MRI brain: 1. Right hemispheric subdural hematoma measuring up to 16 mm in thickness without midline shift or other mass effect. 2. Punctate focus of subacute ischemia within the left thalamus. No other recent ischemia. - NPO  - neurosurgery onboard,repeating CT head - repeat head CT stable; no further interventions planned  Subacute stroke - MRI brain: Punctate focus left thalamic subacute ischemia - family reports that he had an inital fall about 2 weeks ago - Spoke with neuro; unable to start ASA at this time d/t bleed; no need for carotids; initally therapy evals -neurosurg does not recommend resuming ASA in this pt; will continue to hold - PT/OT: rec SNF - SLP: dysphagia 1 (puree, nectar-thick); now NPO  Thrombocytopenia (chronic) - follows with pcp - SCDs for DVT PPx  Cardiac murmr - Echo: EF 60-65%  BPH - Cont Cardura 2mg  po qday     - hold, now electing comfort care   Hyperlipidemia - Cont Simvastatin 20mg  po qday     - hold, now electing comfort care  Glaucoma - Cont Timolol  Memory disorder ? Dementia - Hold Seroquel for now - resumed seroquel at home dosing; dtr reports that he was much more drowsy - now on seroquel  25mg      - seroquel held d/t NPO     - depakote 500mg  IV BID PRN agitation  Dysphagia     - SLP: dysphagia 1 diet     - WBC up today, O2 sats about the same, however, question asp pna/pneumonitis     - check CXR; asp PNA noted  Asp PNA     - CXR: Dense right mid and lower lung infiltrate      - now on unasyn     - SLP eval shows significant asp     - spoke with dtr at bedside about prognosis; she believes comfort care is the correct choice, but she wants to talk with her mother first     - PC consulted  Family now electing comfort care. Will arrange for home hospice.  DVT prophylaxis:SCD Code Status:DNR Family Communication:Spoke with dtr at bedside Disposition Plan:TBD   Consultants:   Neurosurgery  Antimicrobials:  . Unasyn    Subjective: Agitated ON per dtr  Objective: Vitals:   11/05/18 0001 11/05/18 0400 11/05/18 0755 11/05/18 1232  BP: (!) 99/59 95/60 123/71 138/77  Pulse: 66 63 72 64  Resp: 14 14 18  (!) 22  Temp: 98.5 F (36.9 C) 97.8 F (36.6 C) 97.7 F (36.5 C) 98.3 F (36.8  C)  TempSrc: Oral Oral Oral Oral  SpO2: (!) 80% 100% 99% 98%  Weight:        Intake/Output Summary (Last 24 hours) at 11/05/2018 1308 Last data filed at 11/04/2018 1650 Gross per 24 hour  Intake 588.14 ml  Output 275 ml  Net 313.14 ml   Filed Weights   11/01/18 0500 11/02/18 0344 11/03/18 0420  Weight: 74 kg 74 kg 68.8 kg    Examination:  General:85 y.o.maleresting in bed in NAD Cardiovascular: RRR, +S1, S2, no m/g/r, equal pulses throughout Respiratory: CTABL, no w/r/r, normal WOB GI: BS+, NDNT, no masses noted, no organomegaly noted MSK: No e/c/c Skin: No  rashes,ulcerations noted; right forehead bruising noted Neuro:awake, confused, not following commands    Data Reviewed: I have personally reviewed following labs and imaging studies.  CBC: Recent Labs  Lab 10/31/18 1057  11/01/18 0913 11/02/18 1228 11/03/18 0654 11/04/18 0819 11/05/18 0446  WBC 5.8  --  8.0 13.3* 11.5* 18.3* 19.8*  NEUTROABS 3.0  --   --  10.2* 7.2 14.0* 16.5*  HGB 13.6   < > 13.3 14.9 13.8 12.9* 11.8*  HCT 42.2   < > 41.4 46.1 42.2 39.2 36.0*  MCV 96.8  --  95.4 94.5 94.4 93.8 93.3  PLT 74*  --  67* 72* 69* 56* 53*   < > = values in this interval not displayed.   Basic Metabolic Panel: Recent Labs  Lab 11/01/18 0913 11/02/18 1228 11/03/18 0654 11/04/18 0819 11/05/18 0446  NA 138 136 135 140 141  K 4.0 4.0 4.1 3.5 3.3*  CL 105 106 102 106 109  CO2 25 19* 25 24 23   GLUCOSE 89 103* 111* 123* 112*  BUN 9 14 17 23  27*  CREATININE 0.69 0.74 0.77 1.00 0.79  CALCIUM 8.3* 8.4* 8.2* 8.2* 7.9*  MG  --  2.0 2.1  --  2.0  PHOS  --  3.2 1.8* 3.1 3.1   GFR: CrCl cannot be calculated (Unknown ideal weight.). Liver Function Tests: Recent Labs  Lab 10/31/18 1057 11/01/18 0913 11/02/18 1228 11/03/18 0654 11/04/18 0819 11/05/18 0446  AST 20 21  --   --   --   --   ALT 10 10  --   --   --   --   ALKPHOS 57 53  --   --   --   --   BILITOT 0.9 1.1  --   --   --   --   PROT 6.5 6.2*  --   --   --   --   ALBUMIN 3.6 3.1* 3.1* 2.8* 2.6* 2.5*   No results for input(s): LIPASE, AMYLASE in the last 168 hours. No results for input(s): AMMONIA in the last 168 hours. Coagulation Profile: Recent Labs  Lab 10/31/18 1057  INR 1.2   Cardiac Enzymes: No results for input(s): CKTOTAL, CKMB, CKMBINDEX, TROPONINI in the last 168 hours. BNP (last 3 results) No results for input(s): PROBNP in the last 8760 hours. HbA1C: No results for input(s): HGBA1C in the last 72 hours. CBG: No results for input(s): GLUCAP in the last 168 hours. Lipid Profile: No results for  input(s): CHOL, HDL, LDLCALC, TRIG, CHOLHDL, LDLDIRECT in the last 72 hours. Thyroid Function Tests: No results for input(s): TSH, T4TOTAL, FREET4, T3FREE, THYROIDAB in the last 72 hours. Anemia Panel: No results for input(s): VITAMINB12, FOLATE, FERRITIN, TIBC, IRON, RETICCTPCT in the last 72 hours. Sepsis Labs: No results for input(s): PROCALCITON, LATICACIDVEN in the  last 168 hours.  Recent Results (from the past 240 hour(s))  Novel Coronavirus,NAA,(SEND-OUT TO REF LAB - TAT 24-48 hrs); Hosp Order     Status: None   Collection Time: 10/31/18  1:56 PM   Specimen: Nasopharyngeal Swab; Respiratory  Result Value Ref Range Status   SARS-CoV-2, NAA NOT DETECTED NOT DETECTED Final    Comment: (NOTE) This test was developed and its performance characteristics determined by [employer]. This test has not been FDA cleared or approved. This test has been authorized by FDA under an Emergency Use Authorization (EUA). This test is only authorized for the duration of time the declaration that circumstances exist justifying the authorization of the emergency use of in vitro diagnostic tests for detection of SARS-CoV-2 virus and/or diagnosis of COVID-19 infection under section 564(b)(1) of the Act, 21 U.S.C. 527POE-4(M)(3), unless the authorization is terminated or revoked sooner. When diagnostic testing is negative, the possibility of a false negative result should be considered in the context of a patient's recent exposures and the presence of clinical signs and symptoms consistent with COVID-19. An individual without symptoms of COVID-19 and who is not shedding SARS-CoV-2 virus would expect to have a negative (not detected) result in this assay. Performed  At: [facility] [address] [place], [place] [id number] Rush Farmer MD QM:[id number]    [place]  Final    Comment: Performed at [facility], [place] [address] [place], [place]  [address]  Culture, Urine     Status: Abnormal (Preliminary result)   Collection Time: 11/04/18 10:18 AM   Specimen: Urine, Clean Catch  Result Value Ref Range Status   Specimen Description URINE, CLEAN CATCH  Final   Special Requests   Final    NONE Performed at [facility], [place] [address] [place], [place] [address]    Culture >=100,000 COLONIES/mL ESCHERICHIA COLI (A)  Final   Report Status PENDING  Incomplete  Culture, blood (routine x 2)     Status: None (Preliminary result)   Collection Time: 11/04/18 10:56 AM   Specimen: BLOOD RIGHT ARM  Result Value Ref Range Status   Specimen Description BLOOD RIGHT ARM  Final   Special Requests   Final    BOTTLES DRAWN AEROBIC ONLY Blood Culture results may not be optimal due to an inadequate volume of blood received in culture bottles Performed at [facility] [address] [place], [place] [address]    Culture PENDING  Incomplete   Report Status PENDING  Incomplete         Radiology Studies: Dg Chest Port 1 View  Result Date: 11/04/2018 CLINICAL DATA:  Evaluate for aspiration pneumonia. EXAM: PORTABLE CHEST 1 VIEW COMPARISON:  04/17/2018. FINDINGS: Mediastinum normal. Stable cardiomegaly. No pulmonary venous congestion. Dense right mid and lower lung infiltrate. This could be secondary to pneumonia, possibly aspiration pneumonia. Moderate right pleural effusion. No pneumothorax. Contrast in the stomach. IMPRESSION: 1. Dense right mid and lower lung infiltrate. This could be secondary to pneumonia, possibly aspiration pneumonia. Associated moderate right pleural effusion. 2.  Stable cardiomegaly.  No pulmonary venous congestion. Electronically Signed   By: Marcello Moores  Register   On: 11/04/2018 13:05   Dg Swallowing Func-speech Pathology  Result Date: 11/04/2018 Objective Swallowing Evaluation: Type of Study: MBS-Modified Barium Swallow Study  Patient Details Name: TREYVON BLAHUT MRN: [id number] Date of Birth: 11-08-1920 Today's  Date: 11/04/2018 Time: SLP Start Time (ACUTE ONLY): 1150 -SLP Stop Time (ACUTE ONLY): 1218 SLP Time  Calculation (min) (ACUTE ONLY): 28 min Past Medical History: Past Medical History: Diagnosis Date . Hallucinations  . High cholesterol  . Hypertension  . Memory impairment  . Thrombocytopenia ([place])  Past Surgical History: Past Surgical History: Procedure Laterality Date . APPENDECTOMY   . SHOULDER SURGERY   HPI: [name]  is a 85 y.o. male, w hyperlipidemia, BPH, Glaucoma, memory disorder per Md notes, apparently had 2 falls in the past 2 days.  Pt apparently had c/o left sided facial droop for the past 2 days and generalized weakness. Typically walks with walker , but now unable to walk per his daughter.  Didn't seem to use his left hand quite as well.  Pt lives with wife and has caretakers.  MRI revealed Right hemispheric subdural hematoma measuring up to 16 mm in thickness without midline shift or other mass effect, punctate focus of subacute ischemia within the left thalamus, no other recent ischemia and generalized volume loss and chronic ischemic microangiopathy. Pt underwent BSE and was placed on dys1/nectar diet.  Today his WBC is up and concern for aspiration is present.  Follow up for dysphagia indicated, MBS completed.  CXR today showed RML and RLL pna 6/15.  Subjective: pt awake in chair Assessment / Plan / Recommendation CHL Shah CLINICAL IMPRESSIONS 11/04/2018 Clinical Impression Patient presents with moderate oral and severe pharyngeal dysphagia with gross weakness.  Poor pharyngeal contraction, tongue base retraction and laryngeal elevation/closure contributes to residuals and laryngeal penetration.  Pt has NO sensation to residuals.   Aspiration of thin occured due to decreased laryngeal closure and swallow trigger at pyriform sinus. Weak reflexive cough did not clear aspirates.  Various postures including chin tuck, head turn left did NOT help to prevent or decrease residuals.   Pt is most certainly  aspirating residuals and is not clearing with weak cough.  Recommend consider palliative consult to establish goals of care with this 85 yo man. Do not recommend a PEG for this pt with memory deficits as burden of PEG is high for non-proven benefit.  Perhaps family would approve of diet with known risks and mitigation strategies.   SLP Visit Diagnosis Dysphagia, oropharyngeal phase (R13.12) Attention and concentration deficit following -- Frontal lobe and executive function deficit following -- Impact on safety and function Severe aspiration risk;Risk for inadequate nutrition/hydration   CHL Shah TREATMENT RECOMMENDATION 11/04/2018 Treatment Recommendations Therapy as outlined in treatment plan below   Prognosis 11/04/2018 Prognosis for Safe Diet Advancement Fair Barriers to Reach Goals Cognitive deficits;Time post onset;Severity of deficits Barriers/Prognosis Comment -- CHL Shah DIET RECOMMENDATION 11/04/2018 SLP Diet Recommendations NPO except meds;Ice chips PRN after oral care Liquid Administration via Cup;Straw Medication Administration Crushed with puree Compensations Small sips/bites;Slow rate;Follow solids with liquid Postural Changes Remain semi-upright after after feeds/meals (Comment);Seated upright at 90 degrees   CHL Shah OTHER RECOMMENDATIONS 11/04/2018 Recommended Consults -- Oral Care Recommendations Oral care QID Other Recommendations Have oral suction available   CHL Shah FOLLOW UP RECOMMENDATIONS 11/04/2018 Follow up Recommendations Other (comment)   CHL Shah FREQUENCY AND DURATION 11/04/2018 Speech Therapy Frequency (ACUTE ONLY) min 2x/week Treatment Duration 2 weeks      CHL Shah ORAL PHASE 11/04/2018 Oral Phase Impaired Oral - Pudding Teaspoon -- Oral - Pudding Cup -- Oral - Honey Teaspoon -- Oral - Honey Cup -- Oral - Nectar Teaspoon Reduced posterior propulsion;Weak lingual manipulation Oral - Nectar Cup Reduced posterior propulsion;Weak lingual manipulation Oral - Nectar Straw Reduced posterior propulsion;Weak  lingual manipulation Oral - Thin Teaspoon  Reduced posterior propulsion;Weak lingual manipulation Oral - Thin Cup -- Oral - Thin Straw Reduced posterior propulsion;Weak lingual manipulation Oral - Puree Weak lingual manipulation Oral - Mech Soft Reduced posterior propulsion;Weak lingual manipulation Oral - Regular -- Oral - Multi-Consistency -- Oral - Pill -- Oral Phase - Comment --  CHL Shah PHARYNGEAL PHASE 11/04/2018 Pharyngeal Phase Impaired Pharyngeal- Pudding Teaspoon -- Pharyngeal -- Pharyngeal- Pudding Cup -- Pharyngeal -- Pharyngeal- Honey Teaspoon -- Pharyngeal -- Pharyngeal- Honey Cup -- Pharyngeal -- Pharyngeal- Nectar Teaspoon Reduced pharyngeal peristalsis;Reduced epiglottic inversion;Reduced anterior laryngeal mobility;Reduced laryngeal elevation;Reduced airway/laryngeal closure;Reduced tongue base retraction;Pharyngeal residue - valleculae Pharyngeal -- Pharyngeal- Nectar Cup Reduced pharyngeal peristalsis;Reduced epiglottic inversion;Reduced anterior laryngeal mobility;Reduced laryngeal elevation;Pharyngeal residue - valleculae;Reduced tongue base retraction;Reduced airway/laryngeal closure Pharyngeal -- Pharyngeal- Nectar Straw Reduced pharyngeal peristalsis;Reduced epiglottic inversion;Reduced anterior laryngeal mobility;Reduced laryngeal elevation;Reduced airway/laryngeal closure;Reduced tongue base retraction;Pharyngeal residue - valleculae Pharyngeal -- Pharyngeal- Thin Teaspoon Pharyngeal residue - valleculae;Reduced pharyngeal peristalsis;Reduced epiglottic inversion;Reduced anterior laryngeal mobility;Reduced laryngeal elevation;Reduced airway/laryngeal closure;Lateral channel residue;Delayed swallow initiation-pyriform sinuses Pharyngeal -- Pharyngeal- Thin Cup -- Pharyngeal -- Pharyngeal- Thin Straw Reduced laryngeal elevation;Reduced airway/laryngeal closure;Reduced tongue base retraction;Penetration/Aspiration during swallow;Pharyngeal residue - valleculae;Pharyngeal residue -  pyriform;Lateral channel residue;Delayed swallow initiation-pyriform sinuses Pharyngeal Material enters airway, passes BELOW cords and not ejected out despite cough attempt by patient Pharyngeal- Puree Reduced epiglottic inversion;Reduced anterior laryngeal mobility;Reduced laryngeal elevation;Reduced airway/laryngeal closure;Reduced pharyngeal peristalsis;Reduced tongue base retraction;Pharyngeal residue - valleculae;Pharyngeal residue - pyriform Pharyngeal -- Pharyngeal- Mechanical Soft Reduced laryngeal elevation;Reduced pharyngeal peristalsis;Reduced epiglottic inversion;Reduced anterior laryngeal mobility;Reduced airway/laryngeal closure;Reduced tongue base retraction;Pharyngeal residue - valleculae;Pharyngeal residue - pyriform Pharyngeal -- Pharyngeal- Regular -- Pharyngeal -- Pharyngeal- Multi-consistency -- Pharyngeal -- Pharyngeal- Pill -- Pharyngeal -- Pharyngeal Comment chin tuck, head turn left, dry swallows and hock not effective to decrease residuals or clear aspirates  CHL Shah CERVICAL ESOPHAGEAL PHASE 11/04/2018 Cervical Esophageal Phase Impaired Pudding Teaspoon -- Pudding Cup -- Honey Teaspoon -- Honey Cup -- Nectar Teaspoon -- Nectar Cup -- Nectar Straw -- Thin Teaspoon -- Thin Cup -- Thin Straw -- Puree -- Mechanical Soft -- Regular -- Multi-consistency -- Pill -- Cervical Esophageal Comment -- Upon esophageal sweep,minimal residuals distally noted. No radiologist present to confirm findings.  His largest source of dysphagia is pharyngeal, cervical esophageal. [name] 11/04/2018, 1:13 PM      Juan Salk, MS [facility] SLP Acute Rehab Services Pager [phone] Office [phone]              Scheduled Meds: .  stroke: mapping our early stages of recovery book   Does not apply Once  . doxazosin  2 mg Oral Daily  . olopatadine  1 drop Both Eyes QHS  . QUEtiapine  25 mg Oral QHS  . simvastatin  20 mg Oral q1800  . timolol  1 drop Both Eyes BID   Continuous Infusions: .  sodium chloride 75 mL/hr at 11/04/18 1650  . ampicillin-sulbactam (UNASYN) IV 3 g (11/05/18 0624)  . valproate sodium 500 mg (11/05/18 0908)     LOS: 4 days    Time spent: 25 minutes spent in the coordination of care.    Jonnie Finner, DO [facility] Pager [phone]  If 7PM-7AM, please contact night-coverage [email or web address] Password [email or web address] 11/05/2018, 1:08 PM

## 2018-11-05 NOTE — Progress Notes (Signed)
Physical Therapy Treatment Patient Details Name: [name] MRN: [id number] DOB: 1921-01-17 Today's Date: 11/05/2018    History of Present Illness this 85 y.o. male admitted wtih Lt sided facial droop and generalized weakness.  He also has had at least two falls.  MRI revealed Rt hemispheric SDH without midline shift or mass effect, punctate focus of subacute inschemia Lt thalamus.  PMH includes:  thrombocytopenia, memory impairment, HTN, hallucinations     PT Comments    Pt seen with OT as a co-treatment to maximize safe functional mobility. Pt requiring +2 max assist for OOB standing activity. He was able to progress to bouts of +2 mod assist but grossly requiring +2 max for standing balance. R lateral lean and posterior lean noted throughout EOB and standing. Will continue to follow and progress as able per POC.    Follow Up Recommendations  SNF;Supervision/Assistance - 24 hour     Equipment Recommendations  Hospital bed; [facility]   Recommendations for Other Services       Precautions / Restrictions Precautions Precautions: Fall Restrictions Weight Bearing Restrictions: No    Mobility  Bed Mobility Overal bed mobility: Needs Assistance Bed Mobility: Supine to Sit     Supine to sit: Max assist     General bed mobility comments: assist to initiate moving LEs off EOB and assist to lift trunk from bed - pt demonstrates heavy posterior lean   Transfers Overall transfer level: Needs assistance Equipment used: 2 person hand held assist Transfers: Sit to/from Stand Sit to Stand: Max assist;+2 physical assistance Stand pivot transfers: Max assist;+2 physical assistance;+2 safety/equipment       General transfer comment: Pt with heavy posterior lean . Requires assist for anterior excursion when moving into standing and assist for hip and trunk extension   Ambulation/Gait                 Stairs             Wheelchair Mobility    Modified Rankin  (Stroke Patients Only)       Balance Overall balance assessment: Needs assistance Sitting-balance support: Feet supported Sitting balance-Leahy Scale: Poor Sitting balance - Comments: Pt demonstrates heavy Rt and posterior lean.  He requires max A, overall for balance, with periods of mod A  Postural control: Posterior lean;Right lateral lean Standing balance support: Bilateral upper extremity supported Standing balance-Leahy Scale: Poor Standing balance comment: Pt requires max A +2 for standing balance.  He demonstrated brief periods of mod A +2.  He maintained static standing for 4-5 mins                             Cognition Arousal/Alertness: Awake/alert Behavior During Therapy: Restless;Impulsive Overall Cognitive Status: Impaired/Different from baseline Area of Impairment: Orientation;Attention;Memory;Following commands;Safety/judgement;Problem solving                 Orientation Level: Disoriented to;Place;Time;Situation Current Attention Level: Focused   Following Commands: Follows one step commands inconsistently;Follows one step commands with increased time Safety/Judgement: Decreased awareness of deficits;Decreased awareness of safety   Problem Solving: Slow processing;Decreased initiation;Difficulty sequencing;Requires verbal cues;Requires tactile cues General Comments: Pt very distracted today and demonstrates focused attention.  He is very restless and impulsive.        Exercises      General Comments        Pertinent Vitals/Pain Pain Assessment: Faces Faces Pain Scale: No hurt    Home Living  Prior Function            PT Goals (current goals can now be found in the care plan section) Acute Rehab PT Goals PT Goal Formulation: With patient Time For Goal Achievement: 11/16/18 Potential to Achieve Goals: Fair Progress towards PT goals: Progressing toward goals    Frequency    Min 3X/week      PT  Plan Current plan remains appropriate    Co-evaluation PT/OT/SLP Co-Evaluation/Treatment: Yes Reason for Co-Treatment: Complexity of the patient's impairments (multi-system involvement);Necessary to address cognition/behavior during functional activity;For patient/therapist safety;To address functional/ADL transfers PT goals addressed during session: Mobility/safety with mobility;Balance OT goals addressed during session: Strengthening/ROM;ADL's and self-care      AM-PAC PT "6 Clicks" Mobility   Outcome Measure  Help needed turning from your back to your side while in a flat bed without using bedrails?: A Lot Help needed moving from lying on your back to sitting on the side of a flat bed without using bedrails?: A Lot Help needed moving to and from a bed to a chair (including a wheelchair)?: A Lot Help needed standing up from a chair using your arms (e.g., wheelchair or bedside chair)?: A Lot Help needed to walk in hospital room?: Total Help needed climbing 3-5 steps with a railing? : Total 6 Click Score: 10    End of Session Equipment Utilized During Treatment: Gait belt Activity Tolerance: Patient tolerated treatment well;Patient limited by lethargy Patient left: in chair;with call bell/phone within reach;with chair alarm set;with family/visitor present Nurse Communication: Mobility status PT Visit Diagnosis: Unsteadiness on feet (R26.81);Other abnormalities of gait and mobility (R26.89);Muscle weakness (generalized) (M62.81);History of falling (Z91.81)     Time: 1110-1141 PT Time Calculation (min) (ACUTE ONLY): 31 min  Charges:  $Gait Training: 8-22 mins                     Rolinda Roan, PT, DPT Acute Rehabilitation Services Pager: [phone] Office: [phone]    Thelma Comp 11/05/2018, 1:56 PM

## 2018-11-05 NOTE — Consult Note (Addendum)
Consultation Note Date: 11/05/2018   Patient Name: [name]  DOB: Apr 19, 1921  MRN: [id number]  Age / Sex: 85 y.o., male  PCP: Patient, No Pcp Per Referring Physician: Jonnie Finner, DO  Reason for Consultation: Establishing goals of care  HPI/Patient Profile: 85 y.o. male  with past medical history of memory impairment with possible dementia, hallucinations, HTN admitted on 10/31/2018 with fall resulting in SDH and evidence of subacute ischemic stroke. Hospitalization complicated by ongoing agitation and dysphagia with aspiration pneumonia.   Clinical Assessment and Goals of Care: I met today at [name]'s bedside with his daughter, [name]. [name] is exhausted and tells me that she is disappointed that he will not be able to go to [place] for rehab. She shares that they do not want him to have a feeding tube and she knows that he needs hospice. She has consulted with a friend and is interested in [facility]. We further discussed hospice at home vs hospice facility [facility]) We also discussed home with hospice with potential option to transition to [facility] if needed.   We further discussed comfort and better control of agitation and that this may mean that he is more sleepy and less interactive and she understands and agrees this would be better than his struggling. We discussed ways for her to explain this to her mother and I offer to speak over speaker phone with them both [name] plans to leave to speak with her mother in person). We discussed comfort feeds and that he is likely not even hungry but only desires a little water to keep his mouth moist. She plans to speak with her mother about hospice at home vs hospice facility and see if their caregivers can manage him at home in his current condition.   I did hear back from [place] who has spoken with her mother (and sister coming from [place] in  a couple days) and they have decided to take him home with hospice care and comfort measures. They would like to consider [facility] if his care needs become difficult for them at home.   All questions/concerns addressed. Emotional support provided.   Primary Decision Maker NEXT OF KIN wife and 2 daughters making decisions together (spoke with daughter [name] but not wife as she is hard of hearing and unable to come to the hospital)    [place] with hospice  Code Status/Advance Care Planning:  DNR   Symptom Management:   Agitation: Ativan scheduled BID and every 4 hours prn (IV in hospital and may be given concentrated solution SL at home).   Hallucinations: Haldol 4 mg SL every 6 hours prn.   Pain/dyspnea: Roxanol 5 mg every 3 hours prn.   Palliative Prophylaxis:   Aspiration, Delirium Protocol, Frequent Pain Assessment, Oral Care and Turn Reposition  Additional Recommendations (Limitations, Scope, Preferences):  Full Comfort Care  Psycho-social/Spiritual:   Desire for further Chaplaincy support:yes  Additional Recommendations: Caregiving  Support/Resources, Education on Hospice and [facility]  Prognosis:   < 2 weeks  Discharge Planning: Home with Hospice      Primary Diagnoses: Present on Admission: . Subdural hematoma ([place]) . Thrombocytopenia ([place])   I have reviewed the medical record, interviewed the patient and family, and examined the patient. The following aspects are pertinent.  Past Medical History:  Diagnosis Date  . Hallucinations   . High cholesterol   . Hypertension   . Memory impairment   . Thrombocytopenia ([place])    Social History   Socioeconomic History  . Marital status: Married    Spouse name: Not on file  . Number of children: 2  . Years of education: 26  . Highest education level: Some college, no degree  Occupational History  . Not on file  Social Needs  . Financial resource  strain: Not on file  . Food insecurity    Worry: Not on file    Inability: Not on file  . Transportation needs    Medical: Not on file    Non-medical: Not on file  Tobacco Use  . Smoking status: Never Smoker  . Smokeless tobacco: Never Used  Substance and Sexual Activity  . Alcohol use: Not Currently    Alcohol/week: 2.0 standard drinks    Types: 2 Glasses of wine per week  . Drug use: Never  . Sexual activity: Not on file  Lifestyle  . Physical activity    Days per week: Not on file    Minutes per session: Not on file  . Stress: Not on file  Relationships  . Social Herbalist on phone: Not on file    Gets together: Not on file    Attends religious service: Not on file    Active member of club or organization: Not on file    Attends meetings of clubs or organizations: Not on file    Relationship status: Not on file  Other Topics Concern  . Not on file  Social History Narrative   Lives with wife at home   Caffeine- coffee 1 cup daily   Family History  Problem Relation Age of Onset  . Other Brother        memory issues   Scheduled Meds: .  stroke: mapping our early stages of recovery book   Does not apply Once  . doxazosin  2 mg Oral Daily  . olopatadine  1 drop Both Eyes QHS  . QUEtiapine  25 mg Oral QHS  . simvastatin  20 mg Oral q1800  . timolol  1 drop Both Eyes BID   Continuous Infusions: . sodium chloride 75 mL/hr at 11/04/18 1650  . ampicillin-sulbactam (UNASYN) IV 3 g (11/05/18 0624)  . valproate sodium 500 mg (11/05/18 0908)   PRN Meds:.acetaminophen **OR** acetaminophen, food thickener, Resource ThickenUp Clear No Known Allergies Review of Systems  Unable to perform ROS: Acuity of condition    Physical Exam Vitals signs and nursing note reviewed.  Constitutional:      Comments: Elderly, frail   Cardiovascular:     Rate and Rhythm: Normal rate.  Pulmonary:     Effort: No tachypnea, accessory muscle usage or respiratory distress.   Abdominal:     General: Abdomen is flat.     Palpations: Abdomen is soft.  Neurological:     Mental Status: He is alert. He is disoriented and confused.     Vital Signs: BP 123/71 (BP Location: Right Arm)   Pulse 72   Temp 97.7 F (36.5 C) (  Oral)   Resp 18   Wt 68.8 kg   SpO2 99%  Pain Scale: Faces   Pain Score: 0-No pain   SpO2: SpO2: 99 % O2 Device:SpO2: 99 % O2 Flow Rate: .O2 Flow Rate (L/min): 2 L/min  IO: Intake/output summary:   Intake/Output Summary (Last 24 hours) at 11/05/2018 1035 Last data filed at 11/04/2018 1650 Gross per 24 hour  Intake 588.14 ml  Output 275 ml  Net 313.14 ml    LBM: Last BM Date: 11/04/18 Baseline Weight: Weight: 74 kg Most recent weight: Weight: 68.8 kg     Palliative Assessment/Data:     Time In/Out: 0131-4388, 1430-1450 Time Total: 70 min Greater than 50%  of this time was spent counseling and coordinating care related to the above assessment and plan.  Signed by: Vinie Sill, NP Palliative Medicine Team Pager # [phone] (M-F 8a-5p) Team Phone # [phone] (Nights/Weekends)

## 2018-11-05 NOTE — Progress Notes (Signed)
Occupational Therapy Treatment Patient Details Name: [name] MRN: [id number] DOB: 04-Nov-1920 Today's Date: 11/05/2018    History of present illness this 85 y.o. male admitted wtih Lt sided facial droop and generalized weakness.  He also has had at least two falls.  MRI revealed Rt hemispheric SDH without midline shift or mass effect, punctate focus of subacute inschemia Lt thalamus.  PMH includes:  thrombocytopenia, memory impairment, HTN, hallucinations    OT comments  Pt seen in conjunction with PT.  He was very confused today and restless.  He demonstrates focused attention, and follows one step commands intermittently.  He required max A for bed mobility and max A +2 for standing.  He demonstrates heavy Rt and posterior lean.  He requires total  A for all aspects of ADLs.    Follow Up Recommendations  SNF;Supervision/Assistance - 24 hour    Equipment Recommendations  Hospital bed;Other (comment)    Recommendations for Other Services      Precautions / Restrictions Precautions Precautions: Fall       Mobility Bed Mobility Overal bed mobility: Needs Assistance Bed Mobility: Supine to Sit     Supine to sit: Max assist     General bed mobility comments: assist to initiate moving LEs off EOB and assist to lift trunk from bed - pt demonstrates heavy posterior lean   Transfers Overall transfer level: Needs assistance Equipment used: 2 person hand held assist Transfers: Sit to/from Stand Sit to Stand: Max assist;+2 physical assistance         General transfer comment: Pt with heavy posterior lean . Requires assist for anterior excursion when moving into standing and assist for hip and trunk extension     Balance Overall balance assessment: Needs assistance Sitting-balance support: Feet supported Sitting balance-Leahy Scale: Poor Sitting balance - Comments: Pt demonstrates heavy Rt and posterior lean.  He requires max A, overall for balance, with periods of mod A     Standing balance support: Bilateral upper extremity supported Standing balance-Leahy Scale: Poor Standing balance comment: Pt requires max A +2 for standing balance.  He demonstrated brief periods of mod A +2.  He maintained static standing for 4-5 mins                            ADL either performed or assessed with clinical judgement   ADL Overall ADL's : Needs assistance/impaired     Grooming: Wash/dry face;Maximal assistance;Sitting Grooming Details (indicate cue type and reason): max A to complete task and heavy Rt and posterior lean                  Toilet Transfer: Total assistance Toilet Transfer Details (indicate cue type and reason): unable to safely perform today          Functional mobility during ADLs: Maximal assistance;+2 for safety/equipment;+2 for physical assistance       Vision       Perception     Praxis      Cognition Arousal/Alertness: Awake/alert Behavior During Therapy: Restless;Impulsive Overall Cognitive Status: Impaired/Different from baseline Area of Impairment: Orientation;Attention;Memory;Following commands;Safety/judgement;Problem solving                 Orientation Level: Disoriented to;Place;Time;Situation Current Attention Level: Focused   Following Commands: Follows one step commands inconsistently;Follows one step commands with increased time Safety/Judgement: Decreased awareness of deficits;Decreased awareness of safety   Problem Solving: Slow processing;Decreased initiation;Difficulty sequencing;Requires verbal cues;Requires tactile cues  General Comments: Pt very distracted today and demonstrates focused attention.  He is very restless and impulsive.          Exercises     Shoulder Instructions       General Comments      Pertinent Vitals/ Pain       Pain Assessment: Faces Faces Pain Scale: No hurt  Home Living                                          Prior  Functioning/Environment              Frequency  Min 2X/week        Progress Toward Goals  OT Goals(current goals can now be found in the care plan section)  Progress towards OT goals: Progressing toward goals     Plan Discharge plan remains appropriate    Co-evaluation    PT/OT/SLP Co-Evaluation/Treatment: Yes Reason for Co-Treatment: Complexity of the patient's impairments (multi-system involvement);Necessary to address cognition/behavior during functional activity;For patient/therapist safety;To address functional/ADL transfers   OT goals addressed during session: Strengthening/ROM;ADL's and self-care      AM-PAC OT "6 Clicks" Daily Activity     Outcome Measure   Help from another person eating meals?: Total Help from another person taking care of personal grooming?: Total Help from another person toileting, which includes using toliet, bedpan, or urinal?: Total Help from another person bathing (including washing, rinsing, drying)?: Total Help from another person to put on and taking off regular upper body clothing?: Total Help from another person to put on and taking off regular lower body clothing?: Total 6 Click Score: 6    End of Session Equipment Utilized During Treatment: Gait belt  OT Visit Diagnosis: Unsteadiness on feet (R26.81);Cognitive communication deficit (R41.841)   Activity Tolerance Other (comment)(due to cognitive deficits)   Patient Left in bed;with call bell/phone within reach;with bed alarm set;with family/visitor present(tele sitter )   Nurse Communication Mobility status        Time: 6948-5462 OT Time Calculation (min): 32 min  Charges: OT General Charges $OT Visit: 1 Visit OT Treatments $Neuromuscular Re-education: 8-22 mins  Lucille Passy, OTR/L [place] Pager [phone] Office [phone]    Lucille Passy M 11/05/2018, 12:12 PM

## 2018-11-05 NOTE — TOC Initial Note (Signed)
Transition of Care [facility]) - Initial/Assessment Note    Patient Details  Name: [name] MRN: [id number] Date of Birth: Mar 15, 1921  [facility] CM/SW Contact:    Pollie Friar, RN Phone Number: 11/05/2018, 3:24 PM  Clinical Narrative:                 Received consult from palliative care that patients family wants to take him home with hospice. TOC spoke to daughter [name]) and she is requesting [facility]. Anderson Malta with [facility] notified.  [name] asking for: bed, oxygen, suction and bed pads for home. Anderson Malta made aware.  Family has arranged 24 hour caregivers to assist at home.  TOC following and will arrange PTAR home for the patient when he is ready for d/c.   Expected Discharge Plan: Home w Hospice Care Barriers to Discharge: Continued Medical Work up   Patient Goals and CMS Choice Patient states their goals for this hospitalization and ongoing recovery are:: Pt daughter would like to send her dad to rehab post discharge. CMS Medicare.gov Compare Post Acute Care list provided to:: Patient Represenative (must comment)(daughter) Choice offered to / list presented to : Adult Children  Expected Discharge Plan and Services Expected Discharge Plan: Home w Hospice Care In-house Referral: Clinical Social Work Discharge Planning Services: CM Consult Post Acute Care Choice: Hospice Living arrangements for the past 2 months: [place]: Hospice and [place] Date [place]: 11/05/18 Time [place]: 15 Representative spoke with at [place]: Anderson Malta  Prior Living Arrangements/Services Living arrangements for the past 2 months: Single Family Home Lives with:: Spouse Patient language and need for interpreter reviewed:: No Do you feel safe going back to the place where you live?: Yes      Need for Family Participation in Patient Care: Yes (Comment) Care giver support  system in place?: Yes (comment)(family has arranged 24 hour caregivers) Current home services: Other (comment)(Have private aides that assist pt and wife at home) Criminal Activity/Legal Involvement Pertinent to Current Situation/Hospitalization: No - Comment as needed  Activities of Daily Living      Permission Sought/Granted Permission sought to share information with : Case Manager Permission granted to share information with : Yes, Verbal Permission Granted  Share Information with NAME: [name]  Permission granted to share info w AGENCY: All SNF  Permission granted to share info w Relationship: Daughter     Emotional Assessment Appearance:: Appears stated age Attitude/Demeanor/Rapport: Lethargic Affect (typically observed): Unable to Assess Orientation: : Oriented to Self Alcohol / Substance Use: Not Applicable Psych Involvement: No (comment)  Admission diagnosis:  Subdural hematoma ([place]) [S06.5X9A] Patient Active Problem List   Diagnosis Date Noted  . BPH (benign prostatic hyperplasia) 11/02/2018  . HLD (hyperlipidemia) 11/02/2018  . Memory disorder 11/02/2018  . Glaucoma 11/02/2018  . CVA (cerebral vascular accident) ([place]) 11/02/2018  . Subdural hematoma ([place]) 10/31/2018  . Thrombocytopenia ([place]) 10/31/2018   PCP:  Patient, No Pcp Per Pharmacy:   [facility], [address] [place] [address] Phone: [phone] Fax: [phone]     Social Determinants of Health (SDOH) Interventions    Readmission Risk Interventions No flowsheet data found.

## 2018-11-06 DIAGNOSIS — H4010X0 Unspecified open-angle glaucoma, stage unspecified: Secondary | ICD-10-CM

## 2018-11-06 DIAGNOSIS — E7800 Pure hypercholesterolemia, unspecified: Secondary | ICD-10-CM

## 2018-11-06 MED ORDER — LORAZEPAM 2 MG/ML PO CONC: 0.6000 mg | Freq: Two times a day (BID) | ORAL | 0 refills | Status: AC

## 2018-11-06 MED ORDER — HALOPERIDOL LACTATE 2 MG/ML PO CONC: 4.0000 mg | Freq: Four times a day (QID) | ORAL | 0 refills | Status: AC | PRN

## 2018-11-06 MED ORDER — GLYCOPYRROLATE 0.2 MG/ML IJ SOLN
0.2000 mg | Freq: Three times a day (TID) | INTRAMUSCULAR | Status: DC
  Administered 2018-11-06 (×2): 0.2 mg via INTRAVENOUS
  Filled 2018-11-06 (×2): qty 1

## 2018-11-06 MED ORDER — MORPHINE SULFATE (CONCENTRATE) 10 MG/0.5ML PO SOLN: 5.0000 mg | ORAL | 0 refills | Status: AC | PRN

## 2018-11-06 NOTE — Progress Notes (Signed)
Palliative:  HPI: 85 y.o. male  with past medical history of memory impairment with possible dementia, hallucinations, HTN admitted on 10/31/2018 with fall resulting in SDH and evidence of subacute ischemic stroke. Hospitalization complicated by ongoing agitation and dysphagia with aspiration pneumonia. Agitation improved with comfort medications.   [name] is lying in bed and more restful and with less agitation than he has had previously. Daughter, [name], at bedside. There are preparations for them to return home with hospice today. [name] is nervous about this transition. We discussed symptom management, administration of medications for comfort, comfort feedings, care needs, and benefits of having hospice there. We discussed how to support her mother when they return home as she will be shocked to see how far he has declined. I reassured [name] that we will make sure equipment is in place before he returns home and then everything will fall into place with hospice and their caregivers. All questions/concerns addressed. Emotional support provided.   Exam: Frail, elderly. Confused. Agitation improved, resting comfortably in bed. Breathing normal and without distress.   Plan: - Home with hospice and comfort care.   [id number] 40 min  Cataleia Gade, NP Palliative Medicine Team Pager [phone] (Please see [email or web address] for schedule) Team Phone [phone]    Greater than 50%  of this time was spent counseling and coordinating care related to the above assessment and plan

## 2018-11-06 NOTE — Plan of Care (Signed)
  Problem: Education: Goal: Knowledge of disease or condition will improve Outcome: Completed/Met Goal: Knowledge of secondary prevention will improve Outcome: Completed/Met Goal: Knowledge of patient specific risk factors addressed and post discharge goals established will improve Outcome: Completed/Met Goal: Individualized Educational Video(s) Outcome: Completed/Met   Problem: Coping: Goal: Will verbalize positive feelings about self Outcome: Completed/Met Goal: Will identify appropriate support needs Outcome: Completed/Met   Problem: Health Behavior/Discharge Planning: Goal: Ability to manage health-related needs will improve Outcome: Completed/Met   Problem: Self-Care: Goal: Ability to participate in self-care as condition permits will improve Outcome: Completed/Met Goal: Verbalization of feelings and concerns over difficulty with self-care will improve Outcome: Completed/Met Goal: Ability to communicate needs accurately will improve Outcome: Completed/Met   Problem: Nutrition: Goal: Risk of aspiration will decrease Outcome: Completed/Met Goal: Dietary intake will improve Outcome: Completed/Met   Problem: Intracerebral Hemorrhage Tissue Perfusion: Goal: Complications of Intracerebral Hemorrhage will be minimized Outcome: Completed/Met   Problem: Ischemic Stroke/TIA Tissue Perfusion: Goal: Complications of ischemic stroke/TIA will be minimized Outcome: Completed/Met   Problem: Spontaneous Subarachnoid Hemorrhage Tissue Perfusion: Goal: Complications of Spontaneous Subarachnoid Hemorrhage will be minimized Outcome: Completed/Met   Problem: Education: Goal: Knowledge of General Education information will improve Description: Including pain rating scale, medication(s)/side effects and non-pharmacologic comfort measures Outcome: Completed/Met   Problem: Health Behavior/Discharge Planning: Goal: Ability to manage health-related needs will improve Outcome:  Completed/Met   Problem: Clinical Measurements: Goal: Ability to maintain clinical measurements within normal limits will improve Outcome: Completed/Met Goal: Will remain free from infection Outcome: Completed/Met Goal: Diagnostic test results will improve Outcome: Completed/Met Goal: Respiratory complications will improve Outcome: Completed/Met Goal: Cardiovascular complication will be avoided Outcome: Completed/Met   Problem: Activity: Goal: Risk for activity intolerance will decrease Outcome: Completed/Met   Problem: Nutrition: Goal: Adequate nutrition will be maintained Outcome: Completed/Met   Problem: Coping: Goal: Level of anxiety will decrease Outcome: Completed/Met   Problem: Elimination: Goal: Will not experience complications related to bowel motility Outcome: Completed/Met Goal: Will not experience complications related to urinary retention Outcome: Completed/Met   Problem: Pain Managment: Goal: General experience of comfort will improve Outcome: Completed/Met   Problem: Safety: Goal: Ability to remain free from injury will improve Outcome: Completed/Met   Problem: Skin Integrity: Goal: Risk for impaired skin integrity will decrease Outcome: Completed/Met

## 2018-11-06 NOTE — TOC Transition Note (Signed)
Transition of Care [facility]) - CM/SW Discharge Note   Patient Details  Name: [name] MRN: [id number] Date of Birth: 1921-04-17  Transition of Care [facility]) CM/SW Contact:  Pollie Friar, RN Phone Number: 11/06/2018, 4:08 PM   Clinical Narrative:    Pt discharging home today with home hospice. Daughter states the equipment has been delivered to the home. [facility] requested and arranged for transport home. Transport packet is at the desk and bedside RN is aware. [facility] is aware of d/c tonight.   Final next level of care: Home w Hospice Care Barriers to Discharge: No Barriers Identified   Patient Goals and CMS Choice Patient states their goals for this hospitalization and ongoing recovery are:: Pt daughter would like to send her dad to rehab post discharge. CMS Medicare.gov Compare Post Acute Care list provided to:: Patient Represenative (must comment)(daughter) Choice offered to / list presented to : Adult Children  Discharge Placement                       Discharge Plan and Services In-house Referral: Clinical Social Work Discharge Planning Services: CM Consult Post Acute Care Choice: Hospice                      [facility] Agency: Hospice and [place] Date [place]: 11/05/18 Time [place]: 69 Representative spoke with at [place]: [place] Determinants of Health ([place]) Interventions     Readmission Risk Interventions No flowsheet data found.

## 2018-11-06 NOTE — Progress Notes (Signed)
Manufacturing engineer [facility]) Hospice  Received referral for home hospice services once discharged.  Patient is eligible for hospice at home.  Spoke with daughter [name], confirmed interest, explained services and offered support.  Discussed DME, pt needs:  Bed, suction and O2, was ordered on 6/16 @ 4pm.  Spoke with [name] this am (6/17) they had not contacted yet.  Instructed her to call unit when DME scheduled to be delivered.  [facility] will have a RN go to the home on 6/18 @ 10am to begin services.  Please discharge with any necessary prescriptions for comfort throughout the night until [facility] can arrive tomorrow to begin services.  Thank you, Venia Carbon RN, BSN, [facility] Liaison (in [place]) [phone] (main #)

## 2018-11-06 NOTE — Discharge Summary (Addendum)
Physician Discharge Summary   Patient ID: KHOLE BRANCH MRN: [id number] DOB/AGE: 83-Jun-1922 83 y.o.  Admit date: 10/31/2018 Discharge date: 11/06/2018  Primary Care Physician:  Shon Baton, MD   Recommendations for Outpatient Follow-up:  Comfort care, DNR/DNI  Home Health: Home hospice Equipment/Devices: Case management arranging hospital bed, O2, suction, bed pads   Discharge Condition: Poor prognosis, comfort care CODE STATUS: DNR/DNI Diet recommendation: Comfort care   Discharge Diagnoses:     Subdural hematoma ([place]), moderate, right side  Thrombocytopenia ([place]) Subacute stroke BPH Hyperlipidemia Acute metabolic encephalopathy with underlying dementia Dysphagia Aspiration pneumonia   Consults: Neurosurgery Palliative medicine    Allergies:  No Known Allergies   DISCHARGE MEDICATIONS: Allergies as of 11/06/2018   No Known Allergies      Medication List     STOP taking these medications    doxazosin 4 MG tablet Commonly known as: CARDURA   QUEtiapine 50 MG tablet Commonly known as: SEROQUEL   simvastatin 40 MG tablet Commonly known as: ZOCOR       TAKE these medications    haloperidol 2 MG/ML solution Commonly known as: HALDOL Place 2 mLs (4 mg total) under the tongue every 6 (six) hours as needed for agitation.   LORazepam 2 MG/ML concentrated solution Commonly known as: ATIVAN Place 0.3 mLs (0.6 mg total) under the tongue every 12 (twelve) hours.   morphine CONCENTRATE 10 MG/0.5ML Soln concentrated solution Place 0.25 mLs (5 mg total) under the tongue every 4 (four) hours as needed for severe pain or shortness of breath.   Pazeo 0.7 % Soln Generic drug: Olopatadine HCl Place 1 drop into both eyes at bedtime.   Timolol Maleate 0.5 % (DAILY) Soln Place 1 drop into both eyes 2 (two) times a day.         Brief H and P: For complete details please refer to admission H and P, but in brief *[name]  is a 85 y.o. male, w  hyperlipidemia, BPH, Glaucoma, ? Dementia, apparently had 2 falls in the past 2 days.  Pt apparently had c/o left sided facial droop for the past 2 days and generalized weakness. Typically walks with walker , but now unable to walk per his daughter.  Didn't seem to use his left hand quite as well.  Pt lives with wife and has caretakers.  Daughter states took aleve for his back last nite. Otherwise no aspirin or blood thinner.     Hospital Course:  Moderate right-sided subdural hematoma -MRI of the brain showed right hemispheric subdural hematoma measuring up to 16 mm in thickness without midline shift or other mass-effect.  Punctate focus of subacute ischemia within the left thalamus, no recent ischemia. -Neurosurgery was consulted, recommended repeat CT head which was stable, recommended supportive care Palliative medicine was consulted, family elected for comfort care status.  Discussed in detail with daughter at the bedside, requested home with hospice, equipment.  If unable to take care of him at home, subsequently they will try for residential hospice.  Subacute CVA MRI of the brain showed focus left thalamic subacute ischemia.  Family reported he had a initial fall about 2 weeks ago. Dr. Marylyn Ishihara spoke with neurology, unable to start aspirin at this time due to subdural hematoma No need for carotids, recommended therapy evaluations. PT OT evaluation recommended skilled nursing facility. Residual dysphagia, on pure and nectar thick diet however currently comfort care goals 2D echo showed EF of 60 to 65%   Chronic thrombocytopenia Continue comfort care  status  BPH Currently comfort care status    Hyperlipidemia Continue comfort care status   Glaucoma Continue timolol  Aspiration pneumonia, dysphagia SLP evaluation showed significant aspiration.  Family aware of the poor prognosis, currently comfort care status.  Patient was placed on IV Unasyn.  Dementia Currently comfort care  status     Day of Discharge S: Awake, however does not follow verbal commands, comfortable, daughter at the bedside  BP (!) 151/70 (BP Location: Left Arm)   Pulse 61   Temp 98.6 F (37 C) (Axillary)   Resp 15   Wt 68.6 kg   SpO2 100%   Physical Exam: General: Awake, confused, has underlying dementia HEENT: anicteric sclera, pupils reactive to light and accommodation CVS: S1-S2 clear no murmur rubs or gallops Chest: Bilateral scattered rhonchi Abdomen: soft nontender, nondistended, normal bowel sounds Extremities: no cyanosis, clubbing or edema noted bilaterally Neuro: Does not follow commands   The results of significant diagnostics from this hospitalization (including imaging, microbiology, ancillary and laboratory) are listed below for reference.      Procedures/Studies:  Ct Head Wo Contrast  Result Date: 11/02/2018 CLINICAL DATA:  Subdural hemorrhage. EXAM: CT HEAD WITHOUT CONTRAST TECHNIQUE: Contiguous axial images were obtained from the base of the skull through the vertex without intravenous contrast. COMPARISON:  Head CT and MRI 10/31/2018 FINDINGS: Brain: Mixed density subdural hematoma over the right cerebral convexity is not significantly changed in size measuring up to 13 mm in thickness. There is mild flattening of the underlying brain without midline shift or other significant mass effect. No new intracranial hemorrhage is identified. Moderate cerebral atrophy and moderate chronic small vessel ischemia in the cerebral white matter are again noted. No sizable acute infarct is identified. There are chronic lacunar infarcts in the thalami, and the punctate acute left thalamic infarct on MRI is not differentiated from these on CT. Vascular: Calcified atherosclerosis at the skull base. Skull: No fracture or focal osseous lesion. Sinuses/Orbits: [name] Kitchen Minimal mucosal small left mastoid thickening in the paranasal sinuses effusion. Bilateral cataract extraction. Other: None.  IMPRESSION: Unchanged right-sided subdural hematoma without midline shift. No new intracranial abnormality. Electronically Signed   By: Logan Bores M.D.   On: 11/02/2018 11:00   Ct Head Wo Contrast  Result Date: 10/31/2018 CLINICAL DATA:  Left-sided facial droop and left-sided weakness. EXAM: CT HEAD WITHOUT CONTRAST CT CERVICAL SPINE WITHOUT CONTRAST TECHNIQUE: Multidetector CT imaging of the head and cervical spine was performed following the standard protocol without intravenous contrast. Multiplanar CT image reconstructions of the cervical spine were also generated. COMPARISON:  CT scan 04/17/2018 FINDINGS: CT HEAD FINDINGS Brain: There is a moderate to large right-sided subdural hematoma in the right frontal and parietal regions. However, because of the underlying atrophy I do not really see any significant mass effect on the right lateral ventricle. The sulci and not compressed and is no subfalcine herniation. There is chronic underlying atrophy, ventriculomegaly and periventricular white matter disease. No findings for hemispheric infarction or parenchymal hemorrhage. The brainstem and cerebellum are grossly normal in stable. Vascular: Advanced vascular calcifications but no obvious aneurysm or hyperdense vessels. Skull: No skull fracture or bone lesion. Sinuses/Orbits: The paranasal sinuses and mastoid air cells are grossly clear. The globes are intact. Other: No scalp hematoma or laceration. CT CERVICAL SPINE FINDINGS Alignment: Normal overall alignment. There is advanced degenerative cervical spondylosis with multilevel disc disease and facet disease and mild degenerative subluxation of C3. The facets are normally aligned. Skull base and vertebrae: No  acute fracture. No primary bone lesion or focal pathologic process. Soft tissues and spinal canal: No prevertebral fluid or swelling. No visible canal hematoma. Disc levels: The spinal canal is generous. No significant spinal stenosis. Mild multilevel  foraminal stenosis due to facet disease and uncinate spurring. Upper chest: A right-sided pleural effusion is noted. Calcified granuloma at the right lung apex. Other: No neck mass or adenopathy. Carotid artery calcifications are noted. IMPRESSION: 1. Moderate-sized right-sided subdural hematoma but without significant mass effect. 2. No CT findings for acute hemispheric infarction or parenchymal hemorrhage. 3. No skull fracture. 4. Degenerative cervical spondylosis but no acute cervical spine fracture. Electronically Signed   By: Marijo Sanes M.D.   On: 10/31/2018 12:27   Ct Cervical Spine Wo Contrast  Result Date: 10/31/2018 CLINICAL DATA:  Left-sided facial droop and left-sided weakness. EXAM: CT HEAD WITHOUT CONTRAST CT CERVICAL SPINE WITHOUT CONTRAST TECHNIQUE: Multidetector CT imaging of the head and cervical spine was performed following the standard protocol without intravenous contrast. Multiplanar CT image reconstructions of the cervical spine were also generated. COMPARISON:  CT scan 04/17/2018 FINDINGS: CT HEAD FINDINGS Brain: There is a moderate to large right-sided subdural hematoma in the right frontal and parietal regions. However, because of the underlying atrophy I do not really see any significant mass effect on the right lateral ventricle. The sulci and not compressed and is no subfalcine herniation. There is chronic underlying atrophy, ventriculomegaly and periventricular white matter disease. No findings for hemispheric infarction or parenchymal hemorrhage. The brainstem and cerebellum are grossly normal in stable. Vascular: Advanced vascular calcifications but no obvious aneurysm or hyperdense vessels. Skull: No skull fracture or bone lesion. Sinuses/Orbits: The paranasal sinuses and mastoid air cells are grossly clear. The globes are intact. Other: No scalp hematoma or laceration. CT CERVICAL SPINE FINDINGS Alignment: Normal overall alignment. There is advanced degenerative cervical  spondylosis with multilevel disc disease and facet disease and mild degenerative subluxation of C3. The facets are normally aligned. Skull base and vertebrae: No acute fracture. No primary bone lesion or focal pathologic process. Soft tissues and spinal canal: No prevertebral fluid or swelling. No visible canal hematoma. Disc levels: The spinal canal is generous. No significant spinal stenosis. Mild multilevel foraminal stenosis due to facet disease and uncinate spurring. Upper chest: A right-sided pleural effusion is noted. Calcified granuloma at the right lung apex. Other: No neck mass or adenopathy. Carotid artery calcifications are noted. IMPRESSION: 1. Moderate-sized right-sided subdural hematoma but without significant mass effect. 2. No CT findings for acute hemispheric infarction or parenchymal hemorrhage. 3. No skull fracture. 4. Degenerative cervical spondylosis but no acute cervical spine fracture. Electronically Signed   By: Marijo Sanes M.D.   On: 10/31/2018 12:27   Mr Jeri Cos JW Contrast  Result Date: 10/31/2018 CLINICAL DATA:  Subdural hematoma. Multiple recent falls. Left-sided facial droop for 2 days. EXAM: MRI HEAD WITHOUT AND WITH CONTRAST TECHNIQUE: Multiplanar, multiecho pulse sequences of the brain and surrounding structures were obtained without and with intravenous contrast. CONTRAST:  7 mL Gadavist COMPARISON:  None. FINDINGS: BRAIN: Punctate focus of abnormal diffusion-weighted intensity within the left thalamus. The midline structures are normal. Right holo hemispheric subdural hematoma measures approximately 16 mm in thickness. There is no midline shift or other mass effect. There is hyperenhancement of the right convexity dura. Early confluent hyperintense T2-weighted signal of the periventricular and deep white matter, most commonly due to chronic ischemic microangiopathy. Generalized atrophy without lobar predilection. Susceptibility-sensitive sequences show no chronic  microhemorrhage  or superficial siderosis. No mass lesion. VASCULAR: The major intracranial arterial and venous sinus flow voids are normal. SKULL AND UPPER CERVICAL SPINE: Calvarial bone marrow signal is normal. There is no skull base mass. Visualized upper cervical spine and soft tissues are normal. SINUSES/ORBITS: No fluid levels or advanced mucosal thickening. No mastoid or middle ear effusion. The orbits are normal. IMPRESSION: 1. Right hemispheric subdural hematoma measuring up to 16 mm in thickness without midline shift or other mass effect. 2. Punctate focus of subacute ischemia within the left thalamus. No other recent ischemia. 3. Generalized volume loss and chronic ischemic microangiopathy. Electronically Signed   By: Ulyses Jarred M.D.   On: 10/31/2018 22:25   Dg Chest Port 1 View  Result Date: 11/04/2018 CLINICAL DATA:  Evaluate for aspiration pneumonia. EXAM: PORTABLE CHEST 1 VIEW COMPARISON:  04/17/2018. FINDINGS: Mediastinum normal. Stable cardiomegaly. No pulmonary venous congestion. Dense right mid and lower lung infiltrate. This could be secondary to pneumonia, possibly aspiration pneumonia. Moderate right pleural effusion. No pneumothorax. Contrast in the stomach. IMPRESSION: 1. Dense right mid and lower lung infiltrate. This could be secondary to pneumonia, possibly aspiration pneumonia. Associated moderate right pleural effusion. 2.  Stable cardiomegaly.  No pulmonary venous congestion. Electronically Signed   By: Marcello Moores  Register   On: 11/04/2018 13:05   Dg Swallowing Func-speech Pathology  Result Date: 11/04/2018 Objective Swallowing Evaluation: Type of Study: MBS-Modified Barium Swallow Study  Patient Details Name: [name] MRN: [id number] Date of Birth: 01/06/1921 Today's Date: 11/04/2018 Time: SLP Start Time (ACUTE ONLY): 1150 -SLP Stop Time (ACUTE ONLY): 1218 SLP Time Calculation (min) (ACUTE ONLY): 28 min Past Medical History: Past Medical History: Diagnosis Date   Hallucinations   High cholesterol   Hypertension   Memory impairment   Thrombocytopenia ([place])  Past Surgical History: Past Surgical History: Procedure Laterality Date  APPENDECTOMY    SHOULDER SURGERY   HPI: Edric Fetterman  is a 85 y.o. male, w hyperlipidemia, BPH, Glaucoma, memory disorder per Md notes, apparently had 2 falls in the past 2 days.  Pt apparently had c/o left sided facial droop for the past 2 days and generalized weakness. Typically walks with walker , but now unable to walk per his daughter.  Didn't seem to use his left hand quite as well.  Pt lives with wife and has caretakers.  MRI revealed Right hemispheric subdural hematoma measuring up to 16 mm in thickness without midline shift or other mass effect, punctate focus of subacute ischemia within the left thalamus, no other recent ischemia and generalized volume loss and chronic ischemic microangiopathy. Pt underwent BSE and was placed on dys1/nectar diet.  Today his WBC is up and concern for aspiration is present.  Follow up for dysphagia indicated, MBS completed.  CXR today showed RML and RLL pna 6/15.  Subjective: pt awake in chair Assessment / Plan / Recommendation CHL IP CLINICAL IMPRESSIONS 11/04/2018 Clinical Impression Patient presents with moderate oral and severe pharyngeal dysphagia with gross weakness.  Poor pharyngeal contraction, tongue base retraction and laryngeal elevation/closure contributes to residuals and laryngeal penetration.  Pt has NO sensation to residuals.   Aspiration of thin occured due to decreased laryngeal closure and swallow trigger at pyriform sinus. Weak reflexive cough did not clear aspirates.  Various postures including chin tuck, head turn left did NOT help to prevent or decrease residuals.   Pt is most certainly aspirating residuals and is not clearing with weak cough.  Recommend consider palliative consult to establish goals of  care with this 85 yo man. Do not recommend a PEG for this pt with memory deficits as  burden of PEG is high for non-proven benefit.  Perhaps family would approve of diet with known risks and mitigation strategies.   SLP Visit Diagnosis Dysphagia, oropharyngeal phase (R13.12) Attention and concentration deficit following -- Frontal lobe and executive function deficit following -- Impact on safety and function Severe aspiration risk;Risk for inadequate nutrition/hydration   CHL IP TREATMENT RECOMMENDATION 11/04/2018 Treatment Recommendations Therapy as outlined in treatment plan below   Prognosis 11/04/2018 Prognosis for Safe Diet Advancement Fair Barriers to Reach Goals Cognitive deficits;Time post onset;Severity of deficits Barriers/Prognosis Comment -- CHL IP DIET RECOMMENDATION 11/04/2018 SLP Diet Recommendations NPO except meds;Ice chips PRN after oral care Liquid Administration via Cup;Straw Medication Administration Crushed with puree Compensations Small sips/bites;Slow rate;Follow solids with liquid Postural Changes Remain semi-upright after after feeds/meals (Comment);Seated upright at 90 degrees   CHL IP OTHER RECOMMENDATIONS 11/04/2018 Recommended Consults -- Oral Care Recommendations Oral care QID Other Recommendations Have oral suction available   CHL IP FOLLOW UP RECOMMENDATIONS 11/04/2018 Follow up Recommendations Other (comment)   CHL IP FREQUENCY AND DURATION 11/04/2018 Speech Therapy Frequency (ACUTE ONLY) min 2x/week Treatment Duration 2 weeks      CHL IP ORAL PHASE 11/04/2018 Oral Phase Impaired Oral - Pudding Teaspoon -- Oral - Pudding Cup -- Oral - Honey Teaspoon -- Oral - Honey Cup -- Oral - Nectar Teaspoon Reduced posterior propulsion;Weak lingual manipulation Oral - Nectar Cup Reduced posterior propulsion;Weak lingual manipulation Oral - Nectar Straw Reduced posterior propulsion;Weak lingual manipulation Oral - Thin Teaspoon Reduced posterior propulsion;Weak lingual manipulation Oral - Thin Cup -- Oral - Thin Straw Reduced posterior propulsion;Weak lingual manipulation Oral - Puree  Weak lingual manipulation Oral - Mech Soft Reduced posterior propulsion;Weak lingual manipulation Oral - Regular -- Oral - Multi-Consistency -- Oral - Pill -- Oral Phase - Comment --  CHL IP PHARYNGEAL PHASE 11/04/2018 Pharyngeal Phase Impaired Pharyngeal- Pudding Teaspoon -- Pharyngeal -- Pharyngeal- Pudding Cup -- Pharyngeal -- Pharyngeal- Honey Teaspoon -- Pharyngeal -- Pharyngeal- Honey Cup -- Pharyngeal -- Pharyngeal- Nectar Teaspoon Reduced pharyngeal peristalsis;Reduced epiglottic inversion;Reduced anterior laryngeal mobility;Reduced laryngeal elevation;Reduced airway/laryngeal closure;Reduced tongue base retraction;Pharyngeal residue - valleculae Pharyngeal -- Pharyngeal- Nectar Cup Reduced pharyngeal peristalsis;Reduced epiglottic inversion;Reduced anterior laryngeal mobility;Reduced laryngeal elevation;Pharyngeal residue - valleculae;Reduced tongue base retraction;Reduced airway/laryngeal closure Pharyngeal -- Pharyngeal- Nectar Straw Reduced pharyngeal peristalsis;Reduced epiglottic inversion;Reduced anterior laryngeal mobility;Reduced laryngeal elevation;Reduced airway/laryngeal closure;Reduced tongue base retraction;Pharyngeal residue - valleculae Pharyngeal -- Pharyngeal- Thin Teaspoon Pharyngeal residue - valleculae;Reduced pharyngeal peristalsis;Reduced epiglottic inversion;Reduced anterior laryngeal mobility;Reduced laryngeal elevation;Reduced airway/laryngeal closure;Lateral channel residue;Delayed swallow initiation-pyriform sinuses Pharyngeal -- Pharyngeal- Thin Cup -- Pharyngeal -- Pharyngeal- Thin Straw Reduced laryngeal elevation;Reduced airway/laryngeal closure;Reduced tongue base retraction;Penetration/Aspiration during swallow;Pharyngeal residue - valleculae;Pharyngeal residue - pyriform;Lateral channel residue;Delayed swallow initiation-pyriform sinuses Pharyngeal Material enters airway, passes BELOW cords and not ejected out despite cough attempt by patient Pharyngeal- Puree Reduced  epiglottic inversion;Reduced anterior laryngeal mobility;Reduced laryngeal elevation;Reduced airway/laryngeal closure;Reduced pharyngeal peristalsis;Reduced tongue base retraction;Pharyngeal residue - valleculae;Pharyngeal residue - pyriform Pharyngeal -- Pharyngeal- Mechanical Soft Reduced laryngeal elevation;Reduced pharyngeal peristalsis;Reduced epiglottic inversion;Reduced anterior laryngeal mobility;Reduced airway/laryngeal closure;Reduced tongue base retraction;Pharyngeal residue - valleculae;Pharyngeal residue - pyriform Pharyngeal -- Pharyngeal- Regular -- Pharyngeal -- Pharyngeal- Multi-consistency -- Pharyngeal -- Pharyngeal- Pill -- Pharyngeal -- Pharyngeal Comment chin tuck, head turn left, dry swallows and hock not effective to decrease residuals or clear aspirates  CHL IP CERVICAL ESOPHAGEAL PHASE 11/04/2018 Cervical Esophageal Phase Impaired Pudding Teaspoon --  Pudding Cup -- Honey Teaspoon -- Honey Cup -- Nectar Teaspoon -- Nectar Cup -- Nectar Straw -- Thin Teaspoon -- Thin Cup -- Thin Straw -- Puree -- Mechanical Soft -- Regular -- Multi-consistency -- Pill -- Cervical Esophageal Comment -- Upon esophageal sweep,minimal residuals distally noted. No radiologist present to confirm findings.  His largest source of dysphagia is pharyngeal, cervical esophageal. [name] 11/04/2018, 1:13 PM      [name], MS [facility] SLP Acute Rehab Services Pager [phone] Office [phone]            LAB RESULTS: Basic Metabolic Panel: Recent Labs  Lab 11/04/18 0819 11/05/18 0446  NA 140 141  K 3.5 3.3*  CL 106 109  CO2 24 23  GLUCOSE 123* 112*  BUN 23 27*  CREATININE 1.00 0.79  CALCIUM 8.2* 7.9*  MG  --  2.0  PHOS 3.1 3.1   Liver Function Tests: Recent Labs  Lab 10/31/18 1057 11/01/18 0913  11/04/18 0819 11/05/18 0446  AST 20 21  --   --   --   ALT 10 10  --   --   --   ALKPHOS 57 53  --   --   --   BILITOT 0.9 1.1  --   --   --   PROT 6.5 6.2*  --   --   --   ALBUMIN  3.6 3.1*   < > 2.6* 2.5*   < > = values in this interval not displayed.   No results for input(s): LIPASE, AMYLASE in the last 168 hours. No results for input(s): AMMONIA in the last 168 hours. CBC: Recent Labs  Lab 11/04/18 0819 11/05/18 0446  WBC 18.3* 19.8*  NEUTROABS 14.0* 16.5*  HGB 12.9* 11.8*  HCT 39.2 36.0*  MCV 93.8 93.3  PLT 56* 53*   Cardiac Enzymes: No results for input(s): CKTOTAL, CKMB, CKMBINDEX, TROPONINI in the last 168 hours. BNP: Invalid input(s): POCBNP CBG: No results for input(s): GLUCAP in the last 168 hours.    Disposition and Follow-up:     DISPOSITION: Hospice at home   [place] information for follow-up providers     Shon Baton, MD. Schedule an appointment as soon as possible for a visit.   Specialty: Internal Medicine Why: as needed Contact information: [place] [address] [phone]                           Time coordinating discharge:  35 minutes  Signed:   Estill Cotta M.D. [facility] 11/06/2018, 1:50 PM    Addendum: Coding query Subdural hematoma, right likely traumatic due to mechanical falls at home prior to admission.   Estill Cotta M.D. [facility] 11/20/2018, 4:06 PM  Pager: [phone]

## 2018-11-07 LAB — URINE CULTURE: Culture: >=100000 — AB

## 2018-11-07 LAB — PATHOLOGIST SMEAR REVIEW: Path Review: 6152020

## 2018-11-09 LAB — CULTURE, BLOOD (ROUTINE X 2)
Culture: NO GROWTH
Culture: NO GROWTH

## 2018-11-11 ENCOUNTER — Other Ambulatory Visit: Payer: Self-pay | Admitting: *Deleted

## 2018-11-11 NOTE — Patient Outreach (Signed)
[facility]) Care Management  11/11/2018  [name] [birth year] [id number]   EMMI-stroke  RED ON EMMI ALERT Day # 1 Date: Saturday 11/09/18 1300 Red Alert Reason: Is patient deceased? Yes Reachable at this phone number? No  Insurance: [employer] medicare [facility] admissions x1  ED visits x 1in the last 6 months    Outreach attempt #1  successful to his daughter, [name] who confirms [name] passed away/is deceased   Plan: [facility] RN CM updated [facility] CMA L Greeson to stop EMMI automated calls [facility] RN CM closed encounter as [name] is deceased   Routed to primary MD  Jack. Lavina Hamman, RN, BSN, [place] Coordinator Office number [phone] Mobile number [phone]  Main [facility] number [phone] Fax number [phone]

## 2018-11-20 DEATH — deceased

## 2019-06-17 IMAGING — CT CT HEAD WITHOUT CONTRAST
4 series · 16 of 47 positions shown, 18 images · non-contrast
Comparison: Head CT and MRI 10/31/2018

CLINICAL DATA: Subdural hemorrhage.

EXAM:
CT HEAD WITHOUT CONTRAST
TECHNIQUE: Contiguous axial images were obtained from the base of the skull
through the vertex without intravenous contrast.

[Series 3: head wo · axial · 0.45mm/px · z∈[-104,+16]mm · 7 of 32 slices shown, 9 images]
[im 4/32  brain]
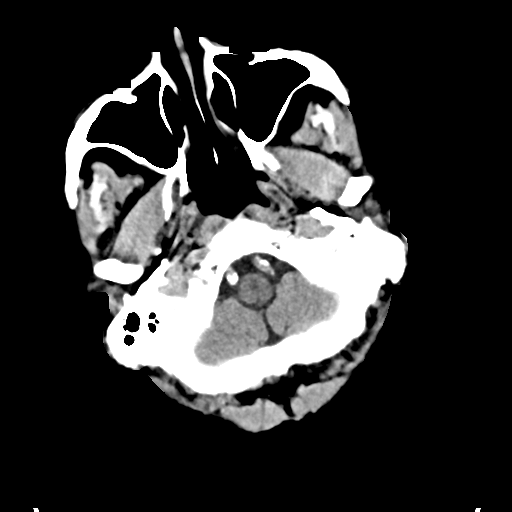
[im 4/32  bone]
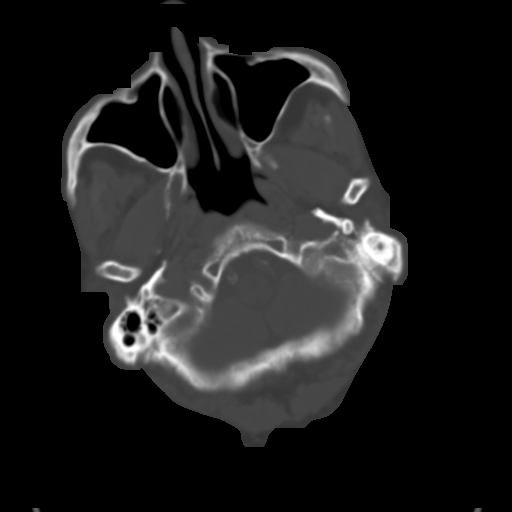
[im 8/32  brain]
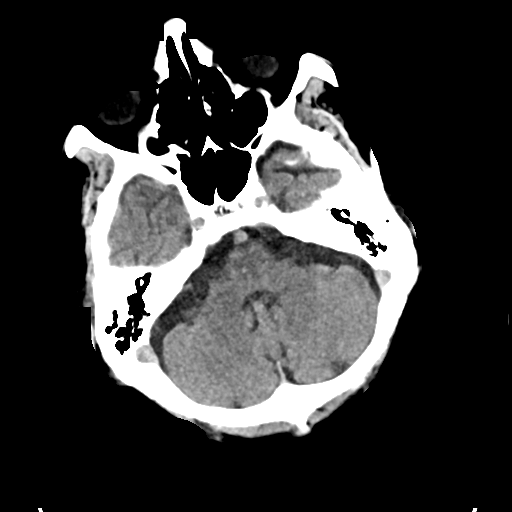
[im 12/32  brain]
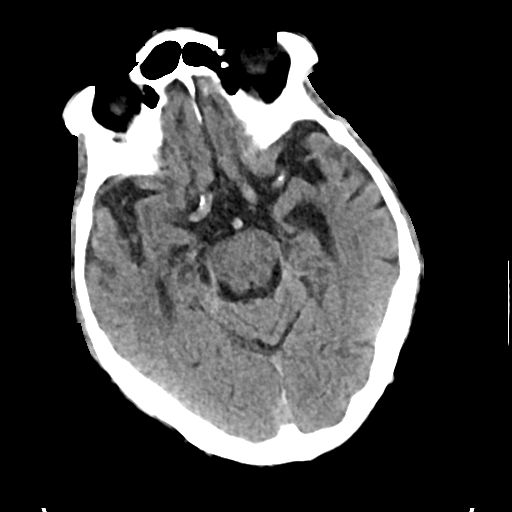
[im 16/32  brain]
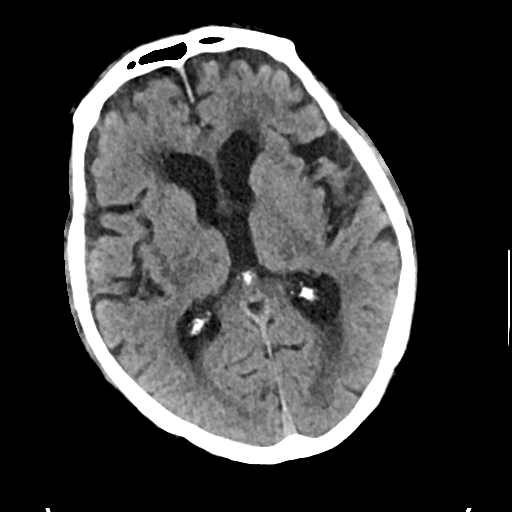
[im 20/32  brain]
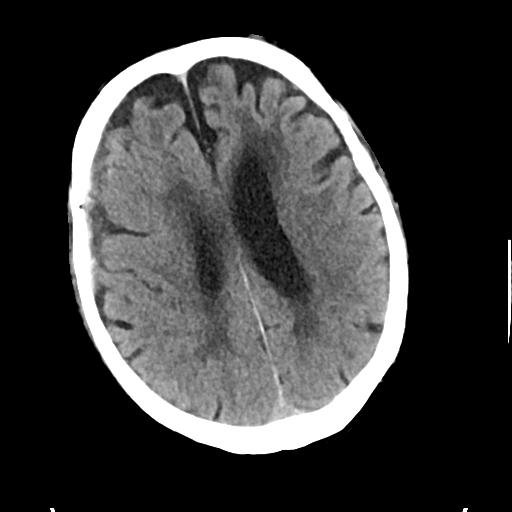
[im 20/32  bone]
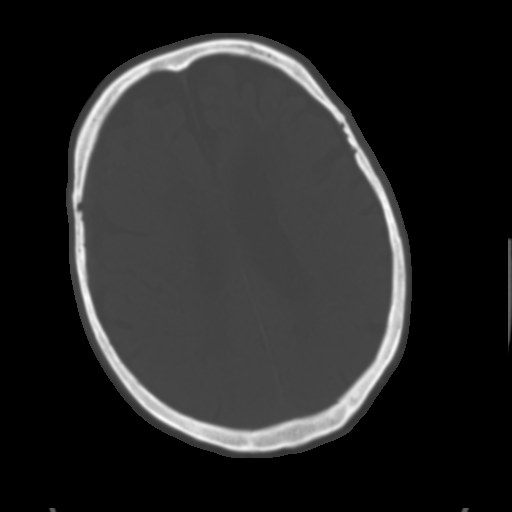
[im 24/32  brain]
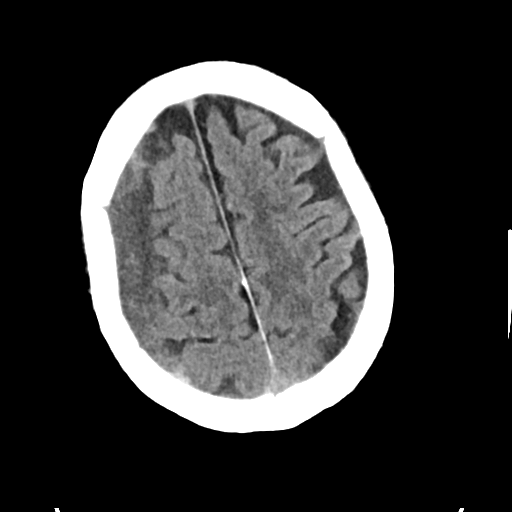
[im 28/32  brain]
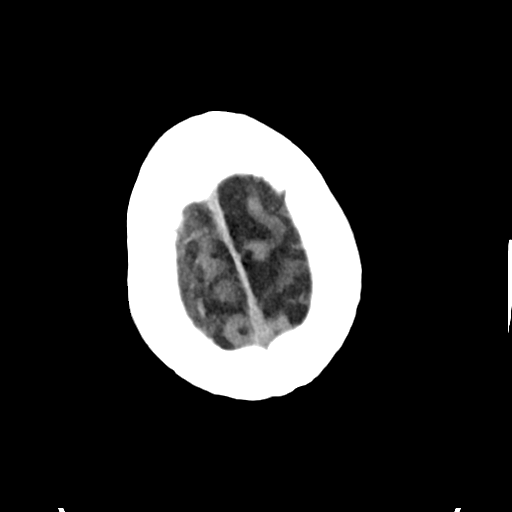

[Series 4: head bone · axial · 0.45mm/px · z∈[-104,-72]mm · 3 of 80 slices shown]
[im 8/80  bone]
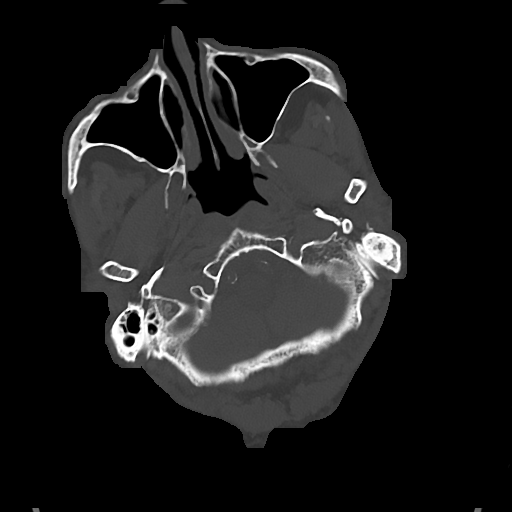
[im 16/80  bone]
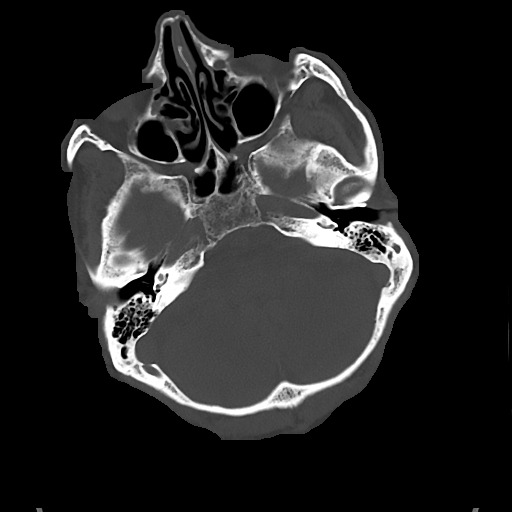
[im 24/80  bone]
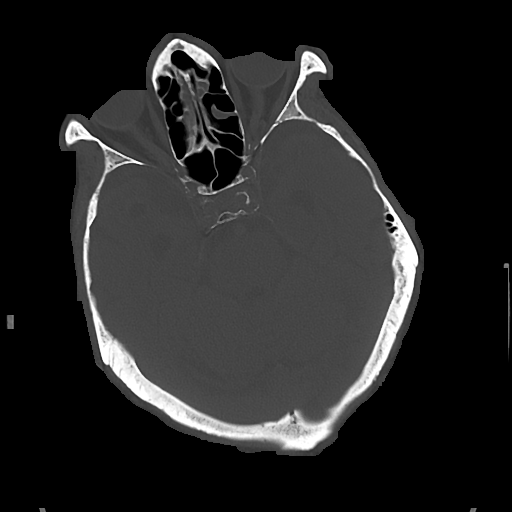

[Series 5: cor soft · coronal · 0.36mm/px · 3 of 70 slices shown]
[im 24/70  brain]
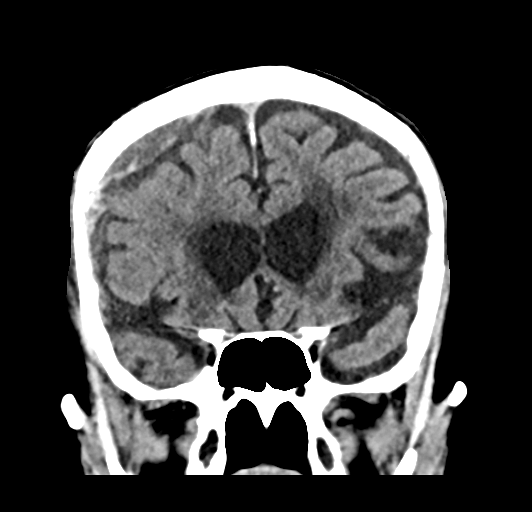
[im 31/70  brain]
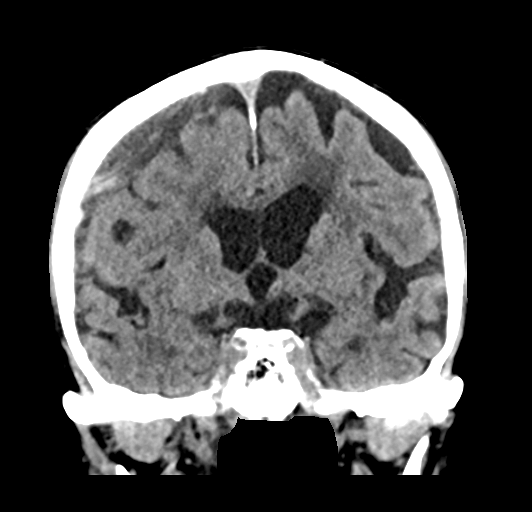
[im 39/70  brain]
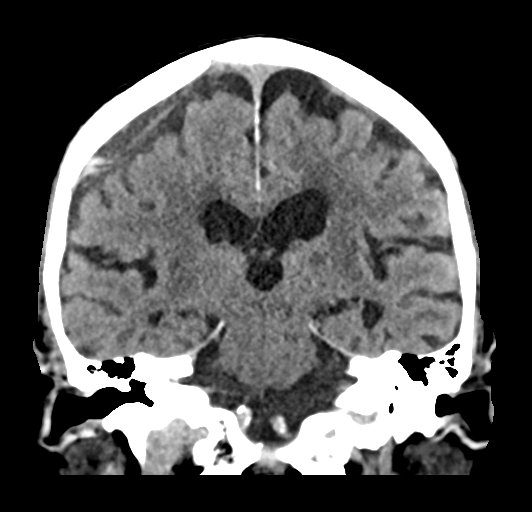

[Series 6: sag soft · sagittal · 0.36mm/px · 3 of 56 slices shown]
[im 19/56  brain]
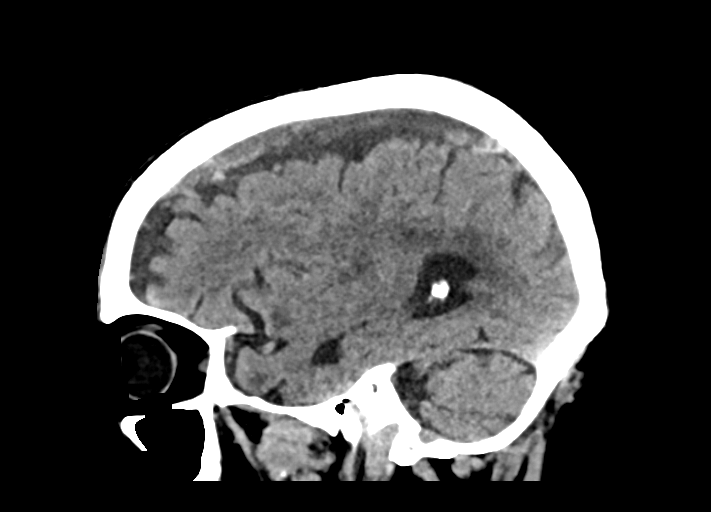
[im 28/56  brain]
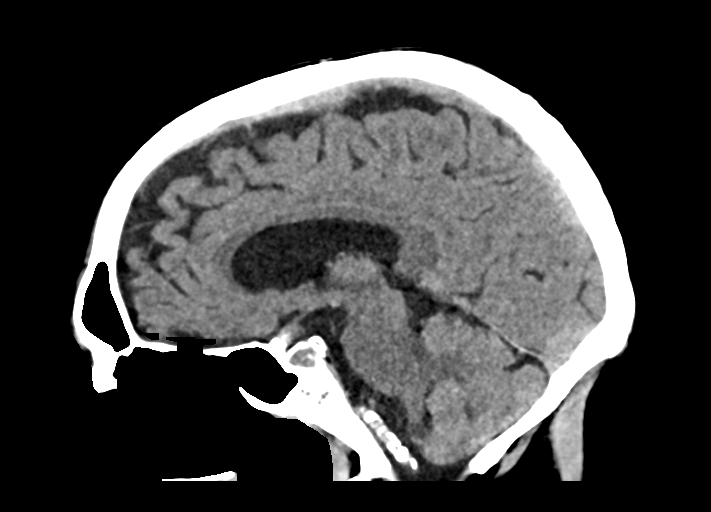
[im 37/56  brain]
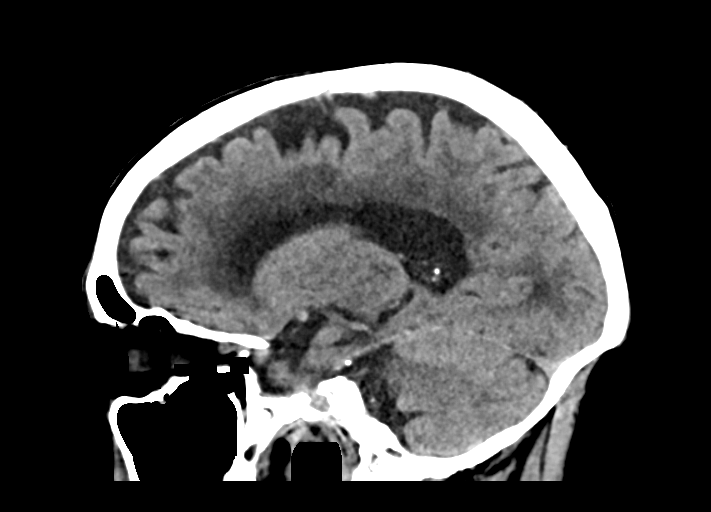

[16 of 47 positions shown; findings below may reference images not displayed]

FINDINGS: Brain: Mixed density subdural hematoma over the right cerebral
convexity is not significantly changed in size measuring up to 13 mm
in thickness. There is mild flattening of the underlying brain
without midline shift or other significant mass effect. No new
intracranial hemorrhage is identified. Moderate cerebral atrophy and
moderate chronic small vessel ischemia in the cerebral white matter
are again noted. No sizable acute infarct is identified. There are
chronic lacunar infarcts in the thalami, and the punctate acute left
thalamic infarct on MRI is not differentiated from these on CT.

Vascular: Calcified atherosclerosis at the skull base.

Skull: No fracture or focal osseous lesion.

Sinuses/Orbits: . Minimal mucosal small left mastoid thickening in
the paranasal sinuses effusion. Bilateral cataract extraction.

Other: None.
IMPRESSION: Unchanged right-sided subdural hematoma without midline shift. No
new intracranial abnormality.

## 2019-06-19 IMAGING — DX PORTABLE CHEST - 1 VIEW
1 series · 1 of 1 positions shown · non-contrast
Comparison: 04/17/2018.

CLINICAL DATA: Evaluate for aspiration pneumonia.

EXAM:
PORTABLE CHEST 1 VIEW

[chest ap]
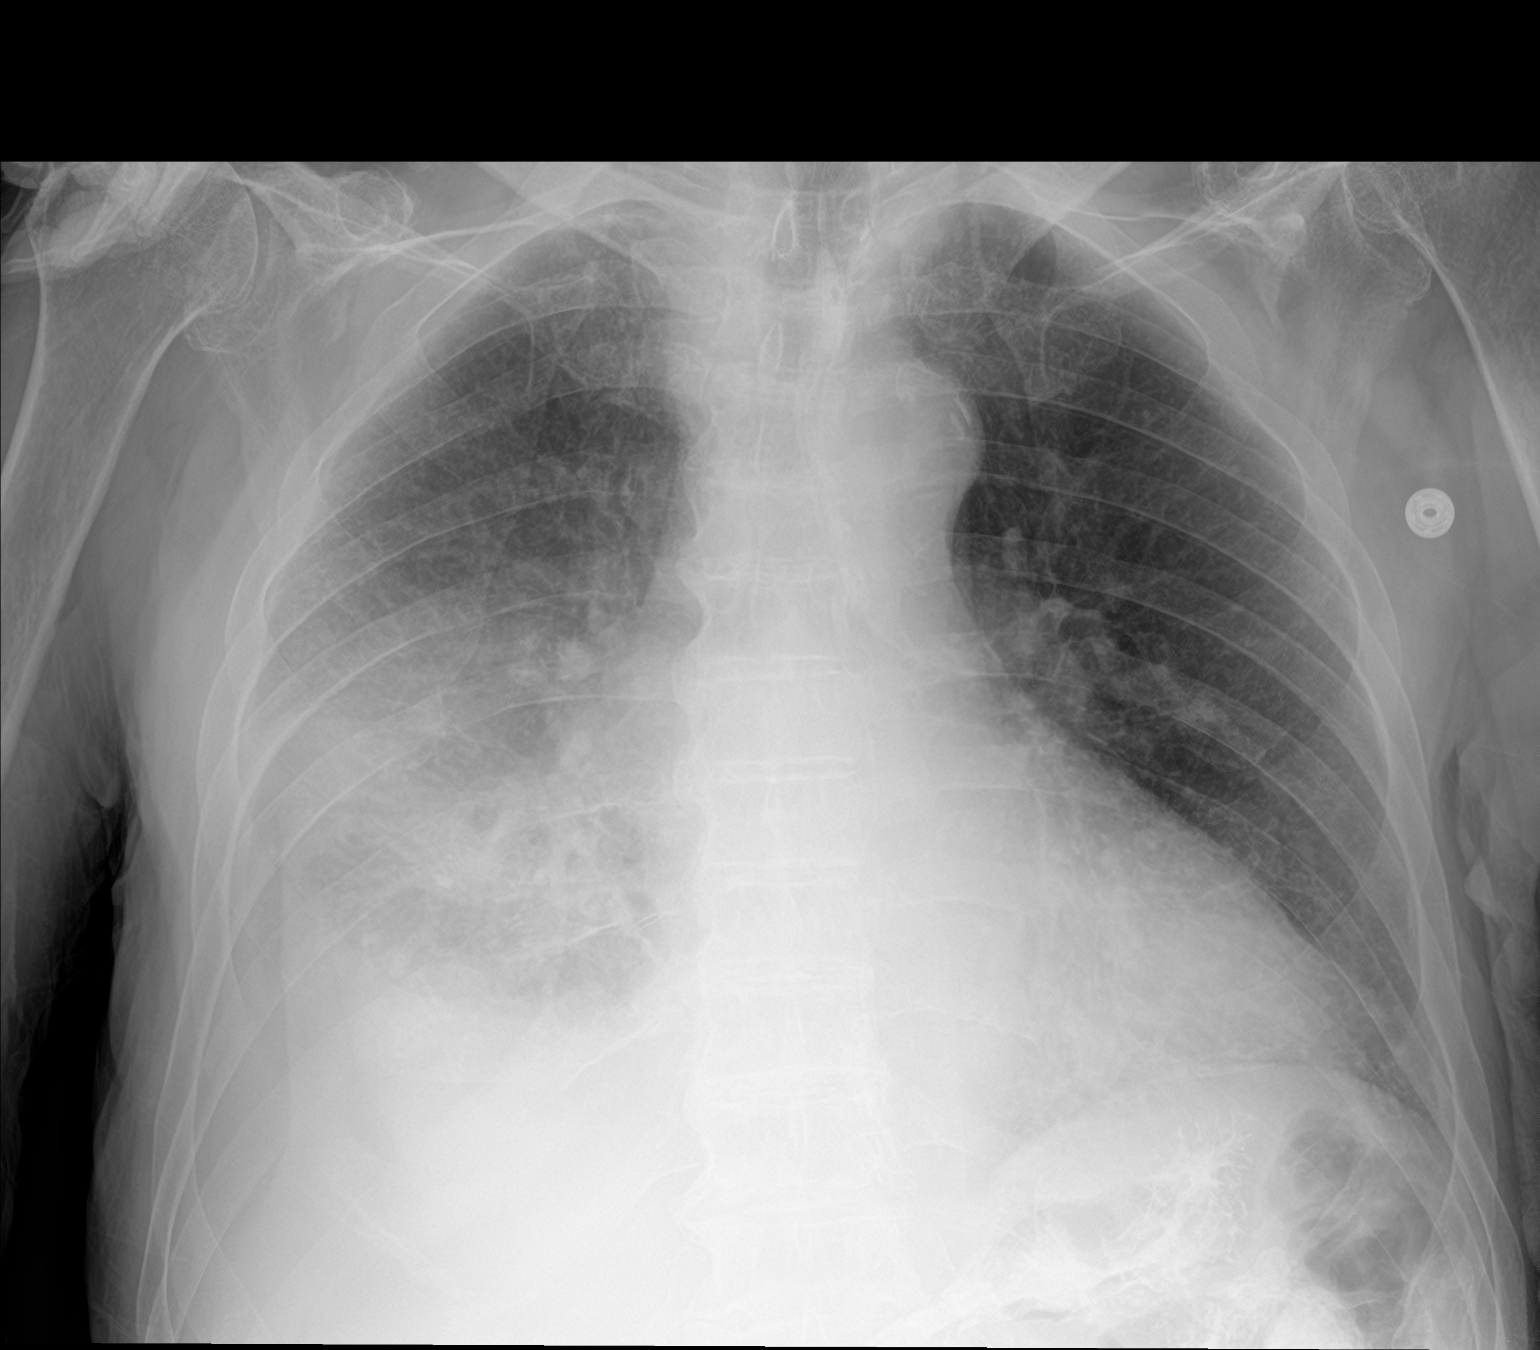

[1 of 1 positions shown; findings below may reference images not displayed]

FINDINGS: Mediastinum normal. Stable cardiomegaly. No pulmonary venous
congestion. Dense right mid and lower lung infiltrate. This could be
secondary to pneumonia, possibly aspiration pneumonia. Moderate
right pleural effusion. No pneumothorax. Contrast in the stomach.
IMPRESSION: 1. Dense right mid and lower lung infiltrate. This could be
secondary to pneumonia, possibly aspiration pneumonia. Associated
moderate right pleural effusion.

2.  Stable cardiomegaly.  No pulmonary venous congestion.
